# Patient Record
Sex: Female | Born: 1955 | Race: White | Hispanic: No | Smoking: Former smoker
Health system: Southern US, Community
[De-identification: ages and names within clinical notes are randomized; demographics above are authoritative.]

## PROBLEM LIST (undated history)

## (undated) DIAGNOSIS — R519 Headache, unspecified: Secondary | ICD-10-CM

## (undated) DIAGNOSIS — R51 Headache: Secondary | ICD-10-CM

## (undated) DIAGNOSIS — Z9289 Personal history of other medical treatment: Secondary | ICD-10-CM

## (undated) DIAGNOSIS — J439 Emphysema, unspecified: Secondary | ICD-10-CM

## (undated) DIAGNOSIS — I25110 Atherosclerotic heart disease of native coronary artery with unstable angina pectoris: Secondary | ICD-10-CM

## (undated) DIAGNOSIS — I214 Non-ST elevation (NSTEMI) myocardial infarction: Secondary | ICD-10-CM

## (undated) DIAGNOSIS — M1990 Unspecified osteoarthritis, unspecified site: Secondary | ICD-10-CM

## (undated) DIAGNOSIS — F419 Anxiety disorder, unspecified: Secondary | ICD-10-CM

## (undated) DIAGNOSIS — J449 Chronic obstructive pulmonary disease, unspecified: Secondary | ICD-10-CM

## (undated) DIAGNOSIS — F4321 Adjustment disorder with depressed mood: Secondary | ICD-10-CM

## (undated) HISTORY — PX: DILATION AND CURETTAGE OF UTERUS: SHX78

## (undated) HISTORY — PX: EYE SURGERY: SHX253

## (undated) HISTORY — PX: MOUTH SURGERY: SHX715

---

## 1982-06-19 DIAGNOSIS — Z9289 Personal history of other medical treatment: Secondary | ICD-10-CM

## 1982-06-19 HISTORY — DX: Personal history of other medical treatment: Z92.89

## 1997-12-15 ENCOUNTER — Other Ambulatory Visit: Admission: RE | Admit: 1997-12-15 | Discharge: 1997-12-15 | Payer: Self-pay | Admitting: Obstetrics & Gynecology

## 1998-11-26 ENCOUNTER — Other Ambulatory Visit: Admission: RE | Admit: 1998-11-26 | Discharge: 1998-11-26 | Payer: Self-pay | Admitting: Obstetrics and Gynecology

## 2000-03-22 ENCOUNTER — Other Ambulatory Visit: Admission: RE | Admit: 2000-03-22 | Discharge: 2000-03-22 | Payer: Self-pay | Admitting: Obstetrics and Gynecology

## 2001-03-25 ENCOUNTER — Other Ambulatory Visit: Admission: RE | Admit: 2001-03-25 | Discharge: 2001-03-25 | Payer: Self-pay | Admitting: Obstetrics and Gynecology

## 2002-09-10 ENCOUNTER — Other Ambulatory Visit: Admission: RE | Admit: 2002-09-10 | Discharge: 2002-09-10 | Payer: Self-pay | Admitting: Obstetrics and Gynecology

## 2003-10-23 ENCOUNTER — Other Ambulatory Visit: Admission: RE | Admit: 2003-10-23 | Discharge: 2003-10-23 | Payer: Self-pay | Admitting: Obstetrics and Gynecology

## 2003-10-27 ENCOUNTER — Encounter: Admission: RE | Admit: 2003-10-27 | Discharge: 2003-10-27 | Payer: Self-pay | Admitting: Obstetrics and Gynecology

## 2005-03-01 ENCOUNTER — Other Ambulatory Visit: Admission: RE | Admit: 2005-03-01 | Discharge: 2005-03-01 | Payer: Self-pay | Admitting: Obstetrics and Gynecology

## 2006-03-09 ENCOUNTER — Other Ambulatory Visit: Admission: RE | Admit: 2006-03-09 | Discharge: 2006-03-09 | Payer: Self-pay | Admitting: Obstetrics and Gynecology

## 2007-06-18 ENCOUNTER — Ambulatory Visit: Payer: Self-pay | Admitting: Gastroenterology

## 2007-07-01 ENCOUNTER — Ambulatory Visit: Payer: Self-pay | Admitting: Gastroenterology

## 2009-06-19 DIAGNOSIS — F4321 Adjustment disorder with depressed mood: Secondary | ICD-10-CM

## 2009-06-19 HISTORY — DX: Adjustment disorder with depressed mood: F43.21

## 2012-08-03 ENCOUNTER — Encounter (HOSPITAL_COMMUNITY): Payer: Self-pay | Admitting: *Deleted

## 2012-08-03 ENCOUNTER — Emergency Department (HOSPITAL_COMMUNITY)
Admission: EM | Admit: 2012-08-03 | Discharge: 2012-08-03 | Disposition: A | Discharge status: Home / Self Care | Payer: Self-pay | Attending: Emergency Medicine | Admitting: Emergency Medicine

## 2012-08-03 DIAGNOSIS — Z87898 Personal history of other specified conditions: Secondary | ICD-10-CM | POA: Insufficient documentation

## 2012-08-03 DIAGNOSIS — K090 Developmental odontogenic cysts: Secondary | ICD-10-CM

## 2012-08-03 DIAGNOSIS — K055 Other periodontal diseases: Secondary | ICD-10-CM | POA: Insufficient documentation

## 2012-08-03 DIAGNOSIS — I10 Essential (primary) hypertension: Secondary | ICD-10-CM | POA: Insufficient documentation

## 2012-08-03 DIAGNOSIS — R6884 Jaw pain: Secondary | ICD-10-CM | POA: Insufficient documentation

## 2012-08-03 DIAGNOSIS — F17200 Nicotine dependence, unspecified, uncomplicated: Secondary | ICD-10-CM | POA: Insufficient documentation

## 2012-08-03 MED ORDER — AMOXICILLIN 500 MG PO CAPS: 500.0000 mg | ORAL_CAPSULE | Freq: Three times a day (TID) | ORAL | Status: DC

## 2012-08-03 MED ORDER — OXYCODONE-ACETAMINOPHEN 5-325 MG PO TABS: 1.0000 | ORAL_TABLET | ORAL | Status: DC | PRN

## 2012-08-03 NOTE — ED Notes (Signed)
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

## 2012-08-05 NOTE — ED Provider Notes (Signed)
History     CSN: [id number]  Arrival date & time 08/03/12  1233   First MD Initiated Contact with Patient 08/03/12 1402      Chief Complaint  Patient presents with  . jaw pain     (Consider location/radiation/quality/duration/timing/severity/associated sxs/prior treatment) HPI Comments: Marie Young is a 57 y.o. Female presenting for evaluation of swelling along her left jawline which has been present for years,  But has become larger and tender over the past several weeks. 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 She suspects this is the source of her current symptoms.  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  She denies fevers and chills and is having no difficulty swallowing.  There has been no drainage from the site and she denies dental pain.  She has taken goody powders for relief of pain or swelling.     The history is provided by the patient.    Past Medical History  Diagnosis Date  . Hypertension     Past Surgical History  Procedure Laterality Date  . Mouth surgery      No family history on file.  History  Substance Use Topics  . Smoking status: Current Every Day Smoker  . Smokeless tobacco: Not on file  . Alcohol Use: No    OB History   Grav Para Term Preterm Abortions TAB SAB Ect Mult Living                  Review of Systems  Constitutional: Negative for fever and chills.  HENT: Positive for facial swelling. Negative for congestion, sore throat, trouble swallowing, neck pain, neck stiffness and dental problem.   Eyes: Negative.   Respiratory: Negative for chest tightness and shortness of breath.   Cardiovascular: Negative for chest pain.   Gastrointestinal: Negative for nausea and abdominal pain.  Genitourinary: Negative.   Musculoskeletal: Negative for joint swelling and arthralgias.  Skin: Negative.  Negative for rash and wound.  Neurological: Negative for dizziness, weakness, numbness and headaches.  Psychiatric/Behavioral: Negative.     Allergies  Hydrocodone; Macrobid; and Sulfa antibiotics  Home Medications   Current Outpatient Rx  Name  Route  Sig  Dispense  Refill  . Aspirin-Acetaminophen-Caffeine (GOODY HEADACHE PO)   Oral   Take 1 packet by mouth once as needed (pain).         Marland Kitchen amoxicillin (AMOXIL) 500 MG capsule   Oral   Take 1 capsule (500 mg total) by mouth 3 (three) times daily.   30 capsule   0   . oxyCODONE-acetaminophen (PERCOCET/ROXICET) 5-325 MG per tablet   Oral   Take 1 tablet by mouth every 4 (four) hours as needed for pain.   15 tablet   0     BP 167/95  Pulse 94  Temp(Src) 98.2 F (36.8 C) (Oral)  Resp 19  SpO2 98%  Physical Exam  Nursing note and vitals reviewed. Constitutional: She is oriented to person, place, and time. She appears well-developed and well-nourished.  HENT:  Head: Normocephalic and atraumatic.  Right Ear: Tympanic membrane normal.  Left Ear: Tympanic membrane normal.  Nose: Nose normal.  Mouth/Throat: Uvula is midline and oropharynx is clear and moist. Oral lesions present. Normal dentition. No dental caries. No oropharyngeal exudate, posterior oropharyngeal edema or posterior oropharyngeal erythema.  Soft,  Distinct pea sized nodule left lower gingival groove at the lateral incisors with buccal mucosal edema and fullness with indistinct margins, without fluctuance or erythema, no drainage, no open lesions.  Eyes: Conjunctivae are normal.  Neck: Normal range of motion. Neck supple.  Cardiovascular: Normal rate, regular rhythm and normal heart sounds.   Pulmonary/Chest: Effort normal and breath sounds normal. No respiratory distress.  Abdominal: She  exhibits no distension.  Musculoskeletal: Normal range of motion.  Neurological: She is alert and oriented to person, place, and time.  Skin: Skin is warm and dry. No erythema.  Psychiatric: She has a normal mood and affect.    ED Course  Procedures (including critical care time)  Informal bedside US performed by Dr. Ranae Palms,  With small fluid filled sac appreciated at site.  INCISION AND DRAINAGE Performed by: Burgess Amor Consent: Verbal consent obtained. Risks and benefits: risks, benefits and alternatives were discussed Type: abscess  Body area: left gingiva/buccal mucosa  Anesthesia: local infiltration  Needle aspiration performed.  Local anesthetic: lidocaine 2% with epinephrine  Anesthetic total: 1 ml  Complexity: simple Drainage: no obvious fluid aspirated,  Neither clear serous or purulent aspirate.  Drainage amount: n/a  Packing material: n/a Patient tolerance: Patient tolerated the procedure well with no immediate complications.     Labs Reviewed - No data to display No results found.   1. Gingival cyst of adult       MDM  Pt encouraged that she needs to see a Associate Professor for further evaluation and excision of this recurrent growth.  Referral to Dr. Warren Danes given.  Since needle aspirate was inconclusive will cover with abx,  Amoxil prescribed.  Also prescribed oxycodone for pain relief. Suggested warm compresses, avoid excessive manipulation.  Return here for any worsened sx.        Burgess Amor, [place] 08/05/12 [phone]

## 2012-08-07 NOTE — ED Provider Notes (Signed)
Medical screening examination/treatment/procedure(s) were performed by non-physician practitioner and as supervising physician I was immediately available for consultation/collaboration.   Loren Racer, MD 08/07/12 1505

## 2012-12-14 ENCOUNTER — Emergency Department (HOSPITAL_COMMUNITY)
Admission: EM | Admit: 2012-12-14 | Discharge: 2012-12-14 | Disposition: A | Discharge status: Home / Self Care | Payer: Self-pay | Attending: Emergency Medicine | Admitting: Emergency Medicine

## 2012-12-14 ENCOUNTER — Encounter (HOSPITAL_COMMUNITY): Payer: Self-pay | Admitting: *Deleted

## 2012-12-14 DIAGNOSIS — Z79899 Other long term (current) drug therapy: Secondary | ICD-10-CM | POA: Insufficient documentation

## 2012-12-14 DIAGNOSIS — R221 Localized swelling, mass and lump, neck: Secondary | ICD-10-CM | POA: Insufficient documentation

## 2012-12-14 DIAGNOSIS — R599 Enlarged lymph nodes, unspecified: Secondary | ICD-10-CM | POA: Insufficient documentation

## 2012-12-14 DIAGNOSIS — K047 Periapical abscess without sinus: Secondary | ICD-10-CM | POA: Insufficient documentation

## 2012-12-14 DIAGNOSIS — I10 Essential (primary) hypertension: Secondary | ICD-10-CM | POA: Insufficient documentation

## 2012-12-14 DIAGNOSIS — R220 Localized swelling, mass and lump, head: Secondary | ICD-10-CM | POA: Insufficient documentation

## 2012-12-14 DIAGNOSIS — F17200 Nicotine dependence, unspecified, uncomplicated: Secondary | ICD-10-CM | POA: Insufficient documentation

## 2012-12-14 MED ORDER — AMOXICILLIN-POT CLAVULANATE 875-125 MG PO TABS
1.0000 | ORAL_TABLET | Freq: Once | ORAL | Status: AC
  Administered 2012-12-14: 1 via ORAL
  Filled 2012-12-14: qty 1

## 2012-12-14 MED ORDER — AMOXICILLIN-POT CLAVULANATE 875-125 MG PO TABS: 1.0000 | ORAL_TABLET | Freq: Two times a day (BID) | ORAL | Status: DC

## 2012-12-14 MED ORDER — OXYCODONE-ACETAMINOPHEN 5-325 MG PO TABS: 2.0000 | ORAL_TABLET | ORAL | Status: DC | PRN

## 2012-12-14 NOTE — ED Notes (Signed)
Patient with left lower jaw swelling.  Patient states she has tumors that she has been seen before for.  Patient states she has has many since she was a teenager.  Dentist that usually deals with them has now retired.  Patient is very sensitive at site of most swelling.  Patient is CAOx3 at this time.

## 2012-12-14 NOTE — ED Provider Notes (Signed)
History    CSN: [id number] Arrival date & time 12/14/12  1758  First MD Initiated Contact with Patient 12/14/12 1939     Chief Complaint  Patient presents with  . Dental Pain  . Facial Swelling   (Consider location/radiation/quality/duration/timing/severity/associated sxs/prior Treatment) HPI Comments: Patient presents with dental pain and facial swelling. 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 She's had 2 removed in the past. She's had a small one that's been there for several years. Over the last 2 days she's developed some increased pain and swelling around the tooth. She denies he fevers or chills. She denies any nausea or vomiting. She denies any shortness of breath or difficulty swallowing. She says the pain is throbbing around her tooth and under her jaw.  Patient is a 57 y.o. female presenting with tooth pain.  Dental Pain Associated symptoms: facial swelling   Associated symptoms: no drooling, no fever, no headaches, no neck pain and no oral lesions    Past Medical History  Diagnosis Date  . Hypertension    Past Surgical History  Procedure Laterality Date  . Mouth surgery     History reviewed. No pertinent family history. History  Substance Use Topics  . Smoking status: Current Every Day Smoker  . Smokeless tobacco: Not on file  . Alcohol Use: No   OB History   Grav Para Term Preterm Abortions TAB SAB Ect Mult Living                 Review of Systems  Constitutional: Negative for fever.  HENT: Positive for facial swelling and dental problem. Negative for sore throat, drooling, mouth sores, trouble swallowing, neck pain and voice change.   Gastrointestinal: Negative for nausea and vomiting.  Skin: Negative for rash and wound.  Neurological: Negative for light-headedness and headaches.    Allergies  Hydrocodone; Macrobid; and Sulfa antibiotics  Home Medications   Current Outpatient Rx  Name  Route  Sig  Dispense  Refill  .  Aspirin-Salicylamide-Caffeine (BC HEADACHE POWDER PO)   Oral   Take 1 packet by mouth daily as needed (for pain).         . cephALEXin (KEFLEX) 500 MG capsule   Oral   Take 500 mg by mouth 2 (two) times daily.         Marland Kitchen guaiFENesin (ROBITUSSIN) 100 MG/5ML SOLN   Oral   Take 10 mLs by mouth every 6 (six) hours as needed (for cough/congestion).         . sodium chloride (OCEAN) 0.65 % nasal spray   Nasal   Place 1 spray into the nose as needed for congestion.         Marland Kitchen amoxicillin-clavulanate (AUGMENTIN) 875-125 MG per tablet   Oral   Take 1 tablet by mouth 2 (two) times daily. One po bid x 10 days   20 tablet   0   . oxyCODONE-acetaminophen (PERCOCET) 5-325 MG per tablet   Oral   Take 2 tablets by mouth every 4 (four) hours as needed for pain.   20 tablet   0    BP 169/89  Pulse 66  Temp(Src) 98.1 F (36.7 C) (Oral)  Resp 20  Ht 5\' 4"  (1.626 m)  Wt 123 lb (55.792 kg)  BMI 21.1 kg/m2  SpO2 97% Physical Exam  Constitutional: She is oriented to person, place, and time. She appears well-developed and well-nourished.  HENT:  Patient has a periapical abscess. around the left lower bicuspid.  There some yellow drainage from the area. There is no elevation of the tongue. There's no trismus. There some mild left cervical lymphadenopathy.  Eyes: Pupils are equal, round, and reactive to light.  Neck: Normal range of motion. Neck supple.  Pulmonary/Chest: Effort normal and breath sounds normal. No respiratory distress.  Lymphadenopathy:    She has cervical adenopathy.  Neurological: She is alert and oriented to person, place, and time.  Skin: Skin is warm and dry.    ED Course  Procedures (including critical care time) Labs Reviewed - No data to display No results found. 1. Periapical abscess     MDM  Patient with the small left. Flap says that it is currently draining. She was started on Augmentin and Percocet for pain. She was advised to followup with the [facility] dentist on call within 48 hours. She is advised to return here over the weekend if she has any worsening symptoms or increased facial swelling.  Rolan Bucco, MD 12/14/12 2019

## 2012-12-14 NOTE — ED Notes (Signed)
Pt reports having dental pain to left lower side and facial swelling. 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 Pain and swelling is spreading down into her neck. Airway is intact.

## 2012-12-16 ENCOUNTER — Telehealth (HOSPITAL_COMMUNITY): Payer: Self-pay | Admitting: Emergency Medicine

## 2012-12-18 ENCOUNTER — Telehealth (HOSPITAL_COMMUNITY): Payer: Self-pay | Admitting: Emergency Medicine

## 2012-12-18 NOTE — ED Notes (Signed)
Dr Lyndal Pulley office called stated pt referred to them needs oral surgeon referral.  Dr Lacretia Leigh office notified Dr Randa Evens office about pt and current writer faxed referral info to Dr Randa Evens office

## 2013-12-02 ENCOUNTER — Emergency Department (INDEPENDENT_AMBULATORY_CARE_PROVIDER_SITE_OTHER)
Admission: EM | Admit: 2013-12-02 | Discharge: 2013-12-02 | Disposition: A | Discharge status: Home / Self Care | Payer: Self-pay | Source: Home / Self Care

## 2013-12-02 ENCOUNTER — Encounter (HOSPITAL_COMMUNITY): Payer: Self-pay | Admitting: Emergency Medicine

## 2013-12-02 DIAGNOSIS — J309 Allergic rhinitis, unspecified: Secondary | ICD-10-CM

## 2013-12-02 DIAGNOSIS — R0982 Postnasal drip: Secondary | ICD-10-CM

## 2013-12-02 DIAGNOSIS — F17200 Nicotine dependence, unspecified, uncomplicated: Secondary | ICD-10-CM

## 2013-12-02 DIAGNOSIS — Z720 Tobacco use: Secondary | ICD-10-CM

## 2013-12-02 DIAGNOSIS — J329 Chronic sinusitis, unspecified: Secondary | ICD-10-CM

## 2013-12-02 DIAGNOSIS — J310 Chronic rhinitis: Secondary | ICD-10-CM

## 2013-12-02 MED ORDER — AMOXICILLIN-POT CLAVULANATE 875-125 MG PO TABS: 1.0000 | ORAL_TABLET | Freq: Two times a day (BID) | ORAL | Status: DC

## 2013-12-02 NOTE — Discharge Instructions (Signed)
Allergic Rhinitis Sudafed PE 10 mg for congestion Allegra or chlortrimeton for drainage flonase nasal spray Lots of saline nasal spray Lots of fluids, cold water Robitussin DM Stop smoking Allergic rhinitis is when the mucous membranes in the nose respond to allergens. Allergens are particles in the air that cause your body to have an allergic reaction. This causes you to release allergic antibodies. Through a chain of events, these eventually cause you to release histamine into the blood stream. Although meant to protect the body, it is this release of histamine that causes your discomfort, such as frequent sneezing, congestion, and an itchy, runny nose.  CAUSES  Seasonal allergic rhinitis (hay fever) is caused by pollen allergens that may come from grasses, trees, and weeds. Year-round allergic rhinitis (perennial allergic rhinitis) is caused by allergens such as house dust mites, pet dander, and mold spores.  SYMPTOMS   Nasal stuffiness (congestion).  Itchy, runny nose with sneezing and tearing of the eyes. DIAGNOSIS  Your health care provider can help you determine the allergen or allergens that trigger your symptoms. If you and your health care provider are unable to determine the allergen, skin or blood testing may be used. TREATMENT  Allergic Rhinitis does not have a cure, but it can be controlled by:  Medicines and allergy shots (immunotherapy).  Avoiding the allergen. Hay fever may often be treated with antihistamines in pill or nasal spray forms. Antihistamines block the effects of histamine. There are over-the-counter medicines that may help with nasal congestion and swelling around the eyes. Check with your health care provider before taking or giving this medicine.  If avoiding the allergen or the medicine prescribed do not work, there are many new medicines your health care provider can prescribe. Stronger medicine may be used if initial measures are ineffective. Desensitizing  injections can be used if medicine and avoidance does not work. Desensitization is when a patient is given ongoing shots until the body becomes less sensitive to the allergen. Make sure you follow up with your health care provider if problems continue. HOME CARE INSTRUCTIONS It is not possible to completely avoid allergens, but you can reduce your symptoms by taking steps to limit your exposure to them. It helps to know exactly what you are allergic to so that you can avoid your specific triggers. SEEK MEDICAL CARE IF:   You have a fever.  You develop a cough that does not stop easily (persistent).  You have shortness of breath.  You start wheezing.  Symptoms interfere with normal daily activities. Document Released: 02/28/2001 Document Revised: 03/26/2013 Document Reviewed: 02/10/2013 [facility] Patient Information 2014 [place], [place].  Sinusitis Sinusitis is redness, soreness, and puffiness (inflammation) of the air pockets in the bones of your face (sinuses). The redness, soreness, and puffiness can cause air and mucus to get trapped in your sinuses. This can allow germs to grow and cause an infection.  HOME CARE   Drink enough fluids to keep your pee (urine) clear or pale yellow.  Use a humidifier in your home.  Run a hot shower to create steam in the bathroom. Sit in the bathroom with the door closed. Breathe in the steam 3 4 times a day.  Put a warm, moist washcloth on your face 3 4 times a day, or as told by your doctor.  Use salt water sprays (saline sprays) to wet the thick fluid in your nose. This can help the sinuses drain.  Only take medicine as told by your doctor. GET HELP  RIGHT AWAY IF:   Your pain gets worse.  You have very bad headaches.  You are sick to your stomach (nauseous).  You throw up (vomit).  You are very sleepy (drowsy) all the time.  Your face is puffy (swollen).  Your vision changes.  You have a stiff neck.  You have trouble  breathing. MAKE SURE YOU:   Understand these instructions.  Will watch your condition.  Will get help right away if you are not doing well or get worse. Document Released: 11/22/2007 Document Revised: 02/28/2012 Document Reviewed: 01/09/2012 [facility] Patient Information 2014 [employer], [place].  Smoking Cessation Quitting smoking is important to your health and has many advantages. However, it is not always easy to quit since nicotine is a very addictive drug. Often times, people try 3 times or more before being able to quit. This document explains the best ways for you to prepare to quit smoking. Quitting takes hard work and a lot of effort, but you can do it. ADVANTAGES OF QUITTING SMOKING  You will live longer, feel better, and live better.  Your body will feel the impact of quitting smoking almost immediately.  Within 20 minutes, blood pressure decreases. Your pulse returns to its normal level.  After 8 hours, carbon monoxide levels in the blood return to normal. Your oxygen level increases.  After 24 hours, the chance of having a heart attack starts to decrease. Your breath, hair, and body stop smelling like smoke.  After 48 hours, damaged nerve endings begin to recover. Your sense of taste and smell improve.  After 72 hours, the body is virtually free of nicotine. Your bronchial tubes relax and breathing becomes easier.  After 2 to 12 weeks, lungs can hold more air. Exercise becomes easier and circulation improves.  The risk of having a heart attack, stroke, cancer, or lung disease is greatly reduced.  After 1 year, the risk of coronary heart disease is cut in half.  After 5 years, the risk of stroke falls to the same as a nonsmoker.  After 10 years, the risk of lung cancer is cut in half and the risk of other cancers decreases significantly.  After 15 years, the risk of coronary heart disease drops, usually to the level of a nonsmoker.  If you are pregnant, quitting  smoking will improve your chances of having a healthy baby.  The people you live with, especially any children, will be healthier.  You will have extra money to spend on things other than cigarettes. QUESTIONS TO THINK ABOUT BEFORE ATTEMPTING TO QUIT You may want to talk about your answers with your caregiver.  Why do you want to quit?  If you tried to quit in the past, what helped and what did not?  What will be the most difficult situations for you after you quit? How will you plan to handle them?  Who can help you through the tough times? Your family? Friends? A caregiver?  What pleasures do you get from smoking? What ways can you still get pleasure if you quit? Here are some questions to ask your caregiver:  How can you help me to be successful at quitting?  What medicine do you think would be best for me and how should I take it?  What should I do if I need more help?  What is smoking withdrawal like? How can I get information on withdrawal? GET READY  Set a quit date.  Change your environment by getting rid of all cigarettes, ashtrays,  matches, and lighters in your home, car, or work. Do not let people smoke in your home.  Review your past attempts to quit. Think about what worked and what did not. GET SUPPORT AND ENCOURAGEMENT You have a better chance of being successful if you have help. You can get support in many ways.  Tell your family, friends, and co-workers that you are going to quit and need their support. Ask them not to smoke around you.  Get individual, group, or telephone counseling and support. Programs are available at [employer] and health centers. Call your local health department for information about programs in your area.  Spiritual beliefs and practices may help some smokers quit.  Download a "quit meter" on your computer to keep track of quit statistics, such as how long you have gone without smoking, cigarettes not smoked, and money  saved.  Get a self-help book about quitting smoking and staying off of tobacco. LEARN NEW SKILLS AND BEHAVIORS  Distract yourself from urges to smoke. Talk to someone, go for a walk, or occupy your time with a task.  Change your normal routine. Take a different route to work. Drink tea instead of coffee. Eat breakfast in a different place.  Reduce your stress. Take a hot bath, exercise, or read a book.  Plan something enjoyable to do every day. Reward yourself for not smoking.  Explore interactive web-based programs that specialize in helping you quit. GET MEDICINE AND USE IT CORRECTLY Medicines can help you stop smoking and decrease the urge to smoke. Combining medicine with the above behavioral methods and support can greatly increase your chances of successfully quitting smoking.  Nicotine replacement therapy helps deliver nicotine to your body without the negative effects and risks of smoking. Nicotine replacement therapy includes nicotine gum, lozenges, inhalers, nasal sprays, and skin patches. Some may be available over-the-counter and others require a prescription.  Antidepressant medicine helps people abstain from smoking, but how this works is unknown. This medicine is available by prescription.  Nicotinic receptor partial agonist medicine simulates the effect of nicotine in your brain. This medicine is available by prescription. Ask your caregiver for advice about which medicines to use and how to use them based on your health history. Your caregiver will tell you what side effects to look out for if you choose to be on a medicine or therapy. Carefully read the information on the package. Do not use any other product containing nicotine while using a nicotine replacement product.  RELAPSE OR DIFFICULT SITUATIONS Most relapses occur within the first 3 months after quitting. Do not be discouraged if you start smoking again. Remember, most people try several times before finally  quitting. You may have symptoms of withdrawal because your body is used to nicotine. You may crave cigarettes, be irritable, feel very hungry, cough often, get headaches, or have difficulty concentrating. The withdrawal symptoms are only temporary. They are strongest when you first quit, but they will go away within 10 14 days. To reduce the chances of relapse, try to:  Avoid drinking alcohol. Drinking lowers your chances of successfully quitting.  Reduce the amount of caffeine you consume. Once you quit smoking, the amount of caffeine in your body increases and can give you symptoms, such as a rapid heartbeat, sweating, and anxiety.  Avoid smokers because they can make you want to smoke.  Do not let weight gain distract you. Many smokers will gain weight when they quit, usually less than 10 pounds. Eat a healthy  diet and stay active. You can always lose the weight gained after you quit.  Find ways to improve your mood other than smoking. FOR MORE INFORMATION  www.smokefree.gov  Document Released: 05/30/2001 Document Revised: 12/05/2011 Document Reviewed: 09/14/2011 [facility] Patient Information 2014 [place], [place].

## 2013-12-02 NOTE — ED Notes (Signed)
Pt c/o cold sx onset 3-4 weeks Sx include: chest/nasal congestion, facial pressure, PND, loss of gustation, decreased appetite.  Denies f/v/n/d, SOB, wheezing Alert w/no signs of acute distress.

## 2013-12-02 NOTE — ED Provider Notes (Signed)
CSN: [id number]     Arrival date & time 12/02/13  1400 History   First MD Initiated Contact with Patient 12/02/13 1525     Chief Complaint  Patient presents with  . URI   (Consider location/radiation/quality/duration/timing/severity/associated sxs/prior Treatment) HPI Comments: 58 year old female who smokes one half pack of cigarettes a day complains of chest congestion nasal congestion and cough beginning 3-4 weeks ago. She did feel a little better for several days but after being exposed to others in her family with same symptoms they redeveloped in her. She is complaining of nasal congestion, ears feeling stopped, PND and cough.    Past Medical History  Diagnosis Date  . Hypertension    Past Surgical History  Procedure Laterality Date  . Mouth surgery     No family history on file. History  Substance Use Topics  . Smoking status: Current Every Day Smoker  . Smokeless tobacco: Not on file  . Alcohol Use: No   OB History   Grav Para Term Preterm Abortions TAB SAB Ect Mult Living                 Review of Systems  Constitutional: Negative for fever, chills, activity change, appetite change and fatigue.  HENT: Positive for congestion, postnasal drip, rhinorrhea, sinus pressure and voice change. Negative for facial swelling.   Eyes: Negative.   Respiratory: Positive for cough. Negative for chest tightness and shortness of breath.   Cardiovascular: Negative.   Gastrointestinal: Negative.   Genitourinary: Negative.   Musculoskeletal: Negative for neck pain and neck stiffness.  Skin: Negative for pallor and rash.  Neurological: Negative.     Allergies  Hydrocodone; Macrobid; and Sulfa antibiotics  Home Medications   Prior to Admission medications   Medication Sig Start Date End Date Taking? Authorizing Provider  amoxicillin-clavulanate (AUGMENTIN) 875-125 MG per tablet Take 1 tablet by mouth 2 (two) times daily. One po bid x 10 days 12/14/12   Rolan BuccoMelanie Belfi, MD   amoxicillin-clavulanate (AUGMENTIN) 875-125 MG per tablet Take 1 tablet by mouth every 12 (twelve) hours. 12/02/13   Hayden Rasmussenavid Angelys Yetman, NP  Aspirin-Salicylamide-Caffeine (BC HEADACHE POWDER PO) Take 1 packet by mouth daily as needed (for pain).    Historical Provider, MD  cephALEXin (KEFLEX) 500 MG capsule Take 500 mg by mouth 2 (two) times daily.    Historical Provider, MD  guaiFENesin (ROBITUSSIN) 100 MG/5ML SOLN Take 10 mLs by mouth every 6 (six) hours as needed (for cough/congestion).    Historical Provider, MD  oxyCODONE-acetaminophen (PERCOCET) 5-325 MG per tablet Take 2 tablets by mouth every 4 (four) hours as needed for pain. 12/14/12   Rolan BuccoMelanie Belfi, MD  sodium chloride (OCEAN) 0.65 % nasal spray Place 1 spray into the nose as needed for congestion.    Historical Provider, MD   BP 158/86  Pulse 83  Temp(Src) 97.7 F (36.5 C) (Oral)  Resp 16  SpO2 100% Physical Exam  Nursing note and vitals reviewed. Constitutional: She is oriented to person, place, and time. She appears well-developed and well-nourished. No distress.  HENT:  Mouth/Throat: No oropharyngeal exudate.  Bilateral TMs with mild retraction, no erythema Oropharynx with moderate erythema, cobblestoning and thick, slightly gray PND.  Eyes: Conjunctivae and EOM are normal.  Neck: Normal range of motion. Neck supple.  Cardiovascular: Normal rate, regular rhythm and normal heart sounds.   Pulmonary/Chest: Effort normal and breath sounds normal. No respiratory distress. She has no wheezes. She has no rales.  Musculoskeletal: Normal range of motion. She  exhibits no edema.  Lymphadenopathy:    She has no cervical adenopathy.  Neurological: She is alert and oriented to person, place, and time.  Skin: Skin is warm and dry. No rash noted.  Psychiatric: She has a normal mood and affect.    ED Course  Procedures (including critical care time) Labs Review Labs Reviewed - No data to display  Imaging Review No results found.   MDM    1. Rhinosinusitis   2. PND (post-nasal drip)   3. Allergic rhinitis   4. Tobacco abuse disorder    augmentin as dir  Sudafed PE 10 mg for congestion Allegra or chlortrimeton for drainage flonase nasal spray Lots of saline nasal spray Lots of fluids, cold water Robitussin DM Stop smoking   Hayden Rasmussenavid Camaria Gerald, NP 12/02/13 1546

## 2013-12-03 NOTE — ED Provider Notes (Signed)
Medical screening examination/treatment/procedure(s) were performed by non-physician practitioner and as supervising physician I was immediately available for consultation/collaboration.  Leslee Homeavid Keller, M.D.  Reuben Likesavid C Keller, MD 12/03/13 [phone]

## 2013-12-24 NOTE — ED Notes (Signed)
Pt  Phoned   Requesting   A  rx  Phoned in  For  Congestion   This  Writer  spoke  With  Hayden Rasmussenavid  mabe    Advised  Needed  To be reseen   Pt so  Advised

## 2014-06-19 HISTORY — PX: CORNEAL LACERATION REPAIR: SHX5331

## 2016-03-20 HISTORY — PX: SHOULDER ARTHROSCOPY WITH ROTATOR CUFF REPAIR: SHX5685

## 2016-06-07 ENCOUNTER — Emergency Department (HOSPITAL_COMMUNITY): Payer: BLUE CROSS/BLUE SHIELD

## 2016-06-07 ENCOUNTER — Encounter (HOSPITAL_COMMUNITY): Payer: Self-pay | Admitting: Neurology

## 2016-06-07 ENCOUNTER — Inpatient Hospital Stay (HOSPITAL_COMMUNITY)
Admission: EM | Admit: 2016-06-07 | Discharge: 2016-06-14 | DRG: 234 | Disposition: A | Discharge status: Home / Self Care | Payer: BLUE CROSS/BLUE SHIELD | Attending: Thoracic Surgery (Cardiothoracic Vascular Surgery) | Admitting: Thoracic Surgery (Cardiothoracic Vascular Surgery)

## 2016-06-07 DIAGNOSIS — Z720 Tobacco use: Secondary | ICD-10-CM | POA: Diagnosis present

## 2016-06-07 DIAGNOSIS — R079 Chest pain, unspecified: Secondary | ICD-10-CM

## 2016-06-07 DIAGNOSIS — I10 Essential (primary) hypertension: Secondary | ICD-10-CM | POA: Diagnosis present

## 2016-06-07 DIAGNOSIS — J9811 Atelectasis: Secondary | ICD-10-CM

## 2016-06-07 DIAGNOSIS — I214 Non-ST elevation (NSTEMI) myocardial infarction: Principal | ICD-10-CM | POA: Diagnosis present

## 2016-06-07 DIAGNOSIS — E8770 Fluid overload, unspecified: Secondary | ICD-10-CM | POA: Diagnosis not present

## 2016-06-07 DIAGNOSIS — D62 Acute posthemorrhagic anemia: Secondary | ICD-10-CM | POA: Diagnosis not present

## 2016-06-07 DIAGNOSIS — F17200 Nicotine dependence, unspecified, uncomplicated: Secondary | ICD-10-CM | POA: Diagnosis present

## 2016-06-07 DIAGNOSIS — Z79899 Other long term (current) drug therapy: Secondary | ICD-10-CM

## 2016-06-07 DIAGNOSIS — J432 Centrilobular emphysema: Secondary | ICD-10-CM | POA: Diagnosis present

## 2016-06-07 DIAGNOSIS — F410 Panic disorder [episodic paroxysmal anxiety] without agoraphobia: Secondary | ICD-10-CM | POA: Diagnosis present

## 2016-06-07 DIAGNOSIS — F419 Anxiety disorder, unspecified: Secondary | ICD-10-CM | POA: Diagnosis present

## 2016-06-07 DIAGNOSIS — Z951 Presence of aortocoronary bypass graft: Secondary | ICD-10-CM

## 2016-06-07 DIAGNOSIS — F329 Major depressive disorder, single episode, unspecified: Secondary | ICD-10-CM | POA: Diagnosis present

## 2016-06-07 DIAGNOSIS — I25110 Atherosclerotic heart disease of native coronary artery with unstable angina pectoris: Secondary | ICD-10-CM | POA: Diagnosis present

## 2016-06-07 DIAGNOSIS — Z7952 Long term (current) use of systemic steroids: Secondary | ICD-10-CM

## 2016-06-07 DIAGNOSIS — I252 Old myocardial infarction: Secondary | ICD-10-CM

## 2016-06-07 DIAGNOSIS — E785 Hyperlipidemia, unspecified: Secondary | ICD-10-CM | POA: Diagnosis present

## 2016-06-07 DIAGNOSIS — Z8249 Family history of ischemic heart disease and other diseases of the circulatory system: Secondary | ICD-10-CM

## 2016-06-07 HISTORY — DX: Emphysema, unspecified: J43.9

## 2016-06-07 HISTORY — DX: Chronic obstructive pulmonary disease, unspecified: J44.9

## 2016-06-07 HISTORY — DX: Non-ST elevation (NSTEMI) myocardial infarction: I21.4

## 2016-06-07 HISTORY — DX: Personal history of other medical treatment: Z92.89

## 2016-06-07 HISTORY — DX: Atherosclerotic heart disease of native coronary artery with unstable angina pectoris: I25.110

## 2016-06-07 HISTORY — DX: Headache: R51

## 2016-06-07 HISTORY — DX: Adjustment disorder with depressed mood: F43.21

## 2016-06-07 HISTORY — DX: Headache, unspecified: R51.9

## 2016-06-07 HISTORY — DX: Unspecified osteoarthritis, unspecified site: M19.90

## 2016-06-07 HISTORY — DX: Anxiety disorder, unspecified: F41.9

## 2016-06-07 LAB — BASIC METABOLIC PANEL
ANION GAP: 9 (ref 5–15)
BUN: 11 mg/dL (ref 6–20)
CALCIUM: 9.6 mg/dL (ref 8.9–10.3)
CO2: 22 mmol/L (ref 22–32)
Chloride: 108 mmol/L (ref 101–111)
Creatinine, Ser: 0.56 mg/dL (ref 0.44–1.00)
Glucose, Bld: 116 mg/dL — ABNORMAL HIGH (ref 65–99)
Potassium: 4.1 mmol/L (ref 3.5–5.1)
SODIUM: 139 mmol/L (ref 135–145)

## 2016-06-07 LAB — CBC
HCT: 41.2 % (ref 36.0–46.0)
HEMOGLOBIN: 13.9 g/dL (ref 12.0–15.0)
MCH: 32 pg (ref 26.0–34.0)
MCHC: 33.7 g/dL (ref 30.0–36.0)
MCV: 94.9 fL (ref 78.0–100.0)
Platelets: 510 10*3/uL — ABNORMAL HIGH (ref 150–400)
RBC: 4.34 MIL/uL (ref 3.87–5.11)
RDW: 14.3 % (ref 11.5–15.5)
WBC: 13.9 10*3/uL — AB (ref 4.0–10.5)

## 2016-06-07 LAB — I-STAT TROPONIN, ED
TROPONIN I, POC: 0.02 ng/mL (ref 0.00–0.08)
TROPONIN I, POC: 0.04 ng/mL (ref 0.00–0.08)

## 2016-06-07 LAB — D-DIMER, QUANTITATIVE (NOT AT ARMC): D DIMER QUANT: 3.44 ug{FEU}/mL — AB (ref 0.00–0.50)

## 2016-06-07 MED ORDER — ASPIRIN 81 MG PO CHEW
324.0000 mg | CHEWABLE_TABLET | Freq: Once | ORAL | Status: AC
  Administered 2016-06-07: 324 mg via ORAL
  Filled 2016-06-07: qty 4

## 2016-06-07 MED ORDER — IOPAMIDOL (ISOVUE-370) INJECTION 76%
INTRAVENOUS | Status: AC
  Administered 2016-06-07: 85 mL
  Filled 2016-06-07: qty 100

## 2016-06-07 NOTE — ED Notes (Signed)
Patient transported to CT 

## 2016-06-07 NOTE — ED Provider Notes (Signed)
[facility]-EMERGENCY DEPT Provider Note   CSN: [id number] Arrival date & time: 06/07/16  1814     History   Chief Complaint Chief Complaint  Patient presents with  . Chest Pain    HPI Hubert AzureSandra K Oguin is a 60 y.o. female.  HPI Patient presents with episodic chest pain for the last few months. She associates this with rotator cuff repair of the right shoulder. She states she has episodic pain that starts in the right shoulder and spreads across her chest and then into her thoracic back. She describes the pain as spasm-like. She's been treated with Valium. States that she was in argument with her daughter's boyfriend. She began to have central chest pain that then radiated to her thoracic back. No associated shortness of breath but patient was quite tearful and upset. She states that her pain has completely resolved at this point. No new lower extremity swelling or pain. No recent extended travel. No history of early coronary artery disease in her family. Patient does have a history of smoking for the past 30 years. States she is trying to quit. Past Medical History:  Diagnosis Date  . Anxiety   . COPD (chronic obstructive pulmonary disease) (HCC)   . Coronary artery disease involving native coronary artery of native heart with unstable angina pectoris (HCC) 06/08/2016  . NSTEMI (non-ST elevated myocardial infarction) (HCC) 06/08/2016    Patient Active Problem List   Diagnosis Date Noted  . Chest pain 06/08/2016  . Centrilobular emphysema (HCC) 06/08/2016  . Tobacco abuse 06/08/2016  . Anxiety 06/08/2016  . NSTEMI (non-ST elevated myocardial infarction) (HCC) 06/08/2016  . Coronary artery disease involving native coronary artery of native heart with unstable angina pectoris (HCC) 06/08/2016    Past Surgical History:  Procedure Laterality Date  . MOUTH SURGERY    . ROTATOR CUFF REPAIR Right 03/20/2016   Dr. Rennis ChrisSupple    OB History    No data available       Home Medications      Prior to Admission medications   Medication Sig Start Date End Date Taking? Authorizing Provider  Aspirin-Salicylamide-Caffeine (BC HEADACHE POWDER PO) Take 1 packet by mouth daily as needed (for pain).   Yes Historical Provider, MD  benzonatate (TESSALON) 100 MG capsule Take 100 mg by mouth 3 (three) times daily as needed for cough. 05/20/16  Yes Historical Provider, MD  COMBIVENT RESPIMAT 20-100 MCG/ACT AERS respimat Inhale 2 puffs into the lungs 2 (two) times daily. 05/20/16  Yes Historical Provider, MD  diazepam (VALIUM) 5 MG tablet Take 5 mg by mouth every morning. 05/09/16  Yes Historical Provider, MD  naproxen (NAPROSYN) 500 MG tablet Take 500 mg by mouth daily as needed for pain. 03/24/16  Yes Historical Provider, MD  traMADol (ULTRAM) 50 MG tablet Take 50 mg by mouth daily as needed for pain. 05/09/16  Yes Historical Provider, MD    Family History Family History  Problem Relation Age of Onset  . Dementia Mother   . Heart disease Sister     CABG?  age 61?  Marland Kitchen. Valvular heart disease Brother     Social History Social History  Substance Use Topics  . Smoking status: Current Every Day Smoker  . Smokeless tobacco: Never Used  . Alcohol use No     Allergies   Macrobid [nitrofurantoin macrocrystal]; Sulfa antibiotics; and Hydrocodone   Review of Systems Review of Systems  Constitutional: Negative for chills and fever.  Respiratory: Negative for cough and shortness of breath.  Cardiovascular: Positive for chest pain. Negative for leg swelling.  Gastrointestinal: Negative for abdominal pain, diarrhea, nausea and vomiting.  Genitourinary: Negative for difficulty urinating, dysuria and flank pain.  Musculoskeletal: Positive for back pain and myalgias. Negative for joint swelling, neck pain and neck stiffness.  Skin: Negative for rash and wound.  Neurological: Negative for dizziness, light-headedness, numbness and headaches.  Psychiatric/Behavioral: The patient is  nervous/anxious.   All other systems reviewed and are negative.    Physical Exam Updated Vital Signs BP 121/67   Pulse 81   Temp 97.6 F (36.4 C) (Oral)   Resp 18   Ht 5\' 4"  (1.626 m)   Wt 121 lb (54.9 kg)   SpO2 94%   BMI 20.77 kg/m   Physical Exam  Constitutional: She is oriented to person, place, and time. She appears well-developed and well-nourished.  HENT:  Head: Normocephalic and atraumatic.  Mouth/Throat: Oropharynx is clear and moist.  Eyes: EOM are normal. Pupils are equal, round, and reactive to light.  Neck: Normal range of motion. Neck supple. No JVD present.  Cardiovascular: Normal rate, regular rhythm and normal heart sounds.  Exam reveals no gallop and no friction rub.   No murmur heard. Pulmonary/Chest: Effort normal and breath sounds normal. No respiratory distress. She has no wheezes. She has no rales. She exhibits no tenderness.  No chest wall tenderness to palpation.  Abdominal: Soft. Bowel sounds are normal. There is no tenderness. There is no rebound and no guarding.  Musculoskeletal: Normal range of motion. She exhibits no edema or tenderness.  No lower extremity swelling, asymmetry or tenderness. No midline thoracic or lumbar tenderness. No CVA tenderness bilaterally. Distal pulses intact.  Neurological: She is alert and oriented to person, place, and time.  Moves all extremity is without deficit. Sensation intact.  Skin: Skin is warm and dry. No rash noted. No erythema.  Psychiatric: She has a normal mood and affect. Her behavior is normal.  Nursing note and vitals reviewed.    ED Treatments / Results  Labs (all labs ordered are listed, but only abnormal results are displayed) Labs Reviewed  BASIC METABOLIC PANEL - Abnormal; Notable for the following:       Result Value   Glucose, Bld 116 (*)    All other components within normal limits  CBC - Abnormal; Notable for the following:    WBC 13.9 (*)    Platelets 510 (*)    All other components  within normal limits  D-DIMER, QUANTITATIVE (NOT AT [facility]) - Abnormal; Notable for the following:    D-Dimer, Quant 3.44 (*)    All other components within normal limits  TROPONIN I - Abnormal; Notable for the following:    Troponin I 0.21 (*)    All other components within normal limits  TROPONIN I - Abnormal; Notable for the following:    Troponin I 0.40 (*)    All other components within normal limits  LIPID PANEL - Abnormal; Notable for the following:    LDL Cholesterol 124 (*)    All other components within normal limits  HEPARIN LEVEL (UNFRACTIONATED) - Abnormal; Notable for the following:    Heparin Unfractionated 0.18 (*)    All other components within normal limits  BASIC METABOLIC PANEL - Abnormal; Notable for the following:    Glucose, Bld 103 (*)    All other components within normal limits  MRSA PCR SCREENING  PROTIME-INR  HEMOGLOBIN A1C  LIPID PANEL  HEMOGLOBIN A1C  I-STAT TROPOININ, ED  Rosezena Sensor, ED    EKG  EKG Interpretation  Date/Time:  Wednesday June 07 2016 22:11:12 EST Ventricular Rate:  79 PR Interval:  136 QRS Duration: 94 QT Interval:  387 QTC Calculation: 444 R Axis:   75 Text Interpretation:  Sinus rhythm Repol abnrm, severe global ischemia (LM/MVD) rate slower since previous , ST depressions still present  Confirmed by YAO  MD, Reveca Desmarais ([id number]) on 06/08/2016 2:48:44 PM       Radiology Dg Chest 2 View  Result Date: 06/07/2016 CLINICAL DATA:  Right-sided chest wall spasms, intermittent for 2 months. Much worse today. EXAM: CHEST  2 VIEW COMPARISON:  None. FINDINGS: There is moderate hyperinflation. Minimal linear scarring or atelectasis in the medial left base. The lungs are otherwise clear. There is no pleural effusion. Pulmonary vasculature is normal. Heart size is normal. Hilar and mediastinal contours are unremarkable. Small hiatal hernia. IMPRESSION: Hyperinflation.  Hiatal hernia.  No acute cardiopulmonary findings. Electronically  Signed   By: Ellery Plunk M.D.   On: 06/07/2016 19:14   Ct Angio Chest Pe W Or Wo Contrast  Result Date: 06/08/2016 CLINICAL DATA:  60 year old female with chest pain. EXAM: CT ANGIOGRAPHY CHEST WITH CONTRAST TECHNIQUE: Multidetector CT imaging of the chest was performed using the standard protocol during bolus administration of intravenous contrast. Multiplanar CT image reconstructions and MIPs were obtained to evaluate the vascular anatomy. CONTRAST:  85 cc Isovue 370 COMPARISON:  Chest radiograph dated 06/07/2016 FINDINGS: Cardiovascular: There is no cardiomegaly or pericardial effusion. Coronary vascular calcification involving the LAD. There is mild calcified and noncalcified plaque along the course of the thoracic aorta. There is no aneurysmal dilatation or evidence of dissection. The origins of the great vessels of the aortic arch appear patent. No CT evidence of pulmonary embolism. Mediastinum/Nodes: No hilar or mediastinal adenopathy. There is moderate size hiatal hernia containing portion of the stomach. The esophagus is grossly unremarkable. No thyroid nodules identified. Lungs/Pleura: There is mild centrilobular emphysema. Small right upper lobe anterior subpleural left. No focal consolidation, pleural effusion, or pneumothorax. Focal area of scarring noted in the lingula. The central airways are patent. Upper Abdomen: No acute abnormality. Musculoskeletal: Multilevel degenerative changes of the spine. No acute fracture. Review of the MIP images confirms the above findings. IMPRESSION: No acute intrathoracic pathology. No CT evidence of pulmonary embolism or aortic dissection. Electronically Signed   By: Elgie Collard M.D.   On: 06/08/2016 00:23    Procedures Procedures (including critical care time)  Medications Ordered in ED Medications  ipratropium-albuterol (DUONEB) 0.5-2.5 (3) MG/3ML nebulizer solution 3 mL ( Inhalation MAR Unhold 06/08/16 1653)  gi cocktail  (Maalox,Lidocaine,Donnatal) ( Oral MAR Unhold 06/08/16 1653)  acetaminophen (TYLENOL) tablet 650 mg (650 mg Oral Given 06/08/16 1812)  ondansetron (ZOFRAN) injection 4 mg ( Intravenous MAR Unhold 06/08/16 1653)  aspirin EC tablet 81 mg ( Oral MAR Unhold 06/08/16 1653)  nitroGLYCERIN (NITROSTAT) SL tablet 0.4 mg ( Sublingual MAR Unhold 06/08/16 1653)  metoprolol tartrate (LOPRESSOR) tablet 12.5 mg ( Oral MAR Unhold 06/08/16 1653)  atorvastatin (LIPITOR) tablet 80 mg ( Oral MAR Unhold 06/08/16 1653)  sodium chloride flush (NS) 0.9 % injection 3 mL (not administered)  sodium chloride flush (NS) 0.9 % injection 3 mL (not administered)  0.9 %  sodium chloride infusion (not administered)  0.9% sodium chloride infusion (1 mL/kg/hr  54.9 kg Intravenous New Bag/Given 06/08/16 1816)  heparin ADULT infusion 100 units/mL (25000 units/243mL sodium chloride 0.45%) (not administered)  diazepam (VALIUM) tablet 5-10 mg (  not administered)  aspirin chewable tablet 324 mg (324 mg Oral Given 06/07/16 2236)  iopamidol (ISOVUE-370) 76 % injection (85 mLs  Contrast Given 06/07/16 2355)  heparin bolus via infusion 3,000 Units (3,000 Units Intravenous Bolus from Bag 06/08/16 0317)  aspirin chewable tablet 81 mg (81 mg Oral Given 06/08/16 1319)  heparin bolus via infusion 800 Units (800 Units Intravenous Bolus from Bag 06/08/16 1115)     Initial Impression / Assessment and Plan / ED Course  I have reviewed the triage vital signs and the nursing notes.  Pertinent labs & imaging results that were available during my care of the patient were reviewed by me and considered in my medical decision making (see chart for details).  Clinical Course      EKG with diffuse ST segment depression.  Patient currently asymptomatic. Discussed with cardiology who reviewed EKG and will follow up on CTA chest. Oncoming emergency room physician to follow-up CTA chest.  Final Clinical Impressions(s) / ED Diagnoses   Final  diagnoses:  Chest pain in adult    New Prescriptions Current Discharge Medication List       Loren Raceravid Jedrick Hutcherson, MD 06/08/16 1830

## 2016-06-07 NOTE — ED Triage Notes (Signed)
Pt reports today has several sharp intermittent chest pains. Reports central cp that radiates across her chest and around to her back. Has been having CP for several weeks but not as bad as today or as intense. Has been under a lot of stress. Pt is crying in triage. 4/10 at current

## 2016-06-08 ENCOUNTER — Inpatient Hospital Stay (HOSPITAL_COMMUNITY): Payer: BLUE CROSS/BLUE SHIELD

## 2016-06-08 ENCOUNTER — Encounter (HOSPITAL_COMMUNITY): Payer: Self-pay | Admitting: Family Medicine

## 2016-06-08 ENCOUNTER — Encounter (HOSPITAL_COMMUNITY)
Admission: EM | Disposition: A | Discharge status: Home / Self Care | Payer: Self-pay | Source: Home / Self Care | Attending: Thoracic Surgery (Cardiothoracic Vascular Surgery)

## 2016-06-08 DIAGNOSIS — F17200 Nicotine dependence, unspecified, uncomplicated: Secondary | ICD-10-CM

## 2016-06-08 DIAGNOSIS — E8770 Fluid overload, unspecified: Secondary | ICD-10-CM | POA: Diagnosis not present

## 2016-06-08 DIAGNOSIS — I10 Essential (primary) hypertension: Secondary | ICD-10-CM | POA: Diagnosis present

## 2016-06-08 DIAGNOSIS — I25110 Atherosclerotic heart disease of native coronary artery with unstable angina pectoris: Secondary | ICD-10-CM | POA: Diagnosis not present

## 2016-06-08 DIAGNOSIS — D62 Acute posthemorrhagic anemia: Secondary | ICD-10-CM | POA: Diagnosis not present

## 2016-06-08 DIAGNOSIS — F419 Anxiety disorder, unspecified: Secondary | ICD-10-CM | POA: Diagnosis not present

## 2016-06-08 DIAGNOSIS — I2510 Atherosclerotic heart disease of native coronary artery without angina pectoris: Secondary | ICD-10-CM

## 2016-06-08 DIAGNOSIS — Z79899 Other long term (current) drug therapy: Secondary | ICD-10-CM | POA: Diagnosis not present

## 2016-06-08 DIAGNOSIS — R079 Chest pain, unspecified: Secondary | ICD-10-CM

## 2016-06-08 DIAGNOSIS — E785 Hyperlipidemia, unspecified: Secondary | ICD-10-CM | POA: Diagnosis present

## 2016-06-08 DIAGNOSIS — J432 Centrilobular emphysema: Secondary | ICD-10-CM | POA: Diagnosis present

## 2016-06-08 DIAGNOSIS — I214 Non-ST elevation (NSTEMI) myocardial infarction: Secondary | ICD-10-CM | POA: Diagnosis present

## 2016-06-08 DIAGNOSIS — Z7952 Long term (current) use of systemic steroids: Secondary | ICD-10-CM | POA: Diagnosis not present

## 2016-06-08 DIAGNOSIS — I252 Old myocardial infarction: Secondary | ICD-10-CM | POA: Diagnosis not present

## 2016-06-08 DIAGNOSIS — Z720 Tobacco use: Secondary | ICD-10-CM | POA: Diagnosis present

## 2016-06-08 DIAGNOSIS — F410 Panic disorder [episodic paroxysmal anxiety] without agoraphobia: Secondary | ICD-10-CM | POA: Diagnosis present

## 2016-06-08 DIAGNOSIS — Z951 Presence of aortocoronary bypass graft: Secondary | ICD-10-CM | POA: Diagnosis not present

## 2016-06-08 DIAGNOSIS — J439 Emphysema, unspecified: Secondary | ICD-10-CM

## 2016-06-08 DIAGNOSIS — J9811 Atelectasis: Secondary | ICD-10-CM | POA: Diagnosis not present

## 2016-06-08 DIAGNOSIS — Z8249 Family history of ischemic heart disease and other diseases of the circulatory system: Secondary | ICD-10-CM | POA: Diagnosis not present

## 2016-06-08 DIAGNOSIS — R071 Chest pain on breathing: Secondary | ICD-10-CM | POA: Diagnosis not present

## 2016-06-08 DIAGNOSIS — F329 Major depressive disorder, single episode, unspecified: Secondary | ICD-10-CM | POA: Diagnosis present

## 2016-06-08 HISTORY — DX: Atherosclerotic heart disease of native coronary artery with unstable angina pectoris: I25.110

## 2016-06-08 HISTORY — PX: CARDIAC CATHETERIZATION: SHX172

## 2016-06-08 LAB — BLOOD GAS, ARTERIAL
Acid-base deficit: 0.8 mmol/L (ref 0.0–2.0)
Bicarbonate: 23.7 mmol/L (ref 20.0–28.0)
Drawn by: 398981
FIO2: 0.21
O2 SAT: 94.8 %
PCO2 ART: 41 mmHg (ref 32.0–48.0)
PO2 ART: 81.4 mmHg — AB (ref 83.0–108.0)
Patient temperature: 98.6
pH, Arterial: 7.379 (ref 7.350–7.450)

## 2016-06-08 LAB — ECHOCARDIOGRAM COMPLETE
CHL CUP DOP CALC LVOT VTI: 21.5 cm
CHL CUP MV DEC (S): 254
E/e' ratio: 7.6
EWDT: 254 ms
FS: 32 % (ref 28–44)
IVS/LV PW RATIO, ED: 0.95
LA diam index: 1.52 cm/m2
LA vol index: 29 mL/m2
LASIZE: 24 mm
LAVOL: 45.7 mL
LAVOLA4C: 41.7 mL
LEFT ATRIUM END SYS DIAM: 24 mm
LV E/e'average: 7.6
LV PW d: 9.5 mm — AB (ref 0.6–1.1)
LV TDI E'MEDIAL: 5.8
LVEEMED: 7.6
LVELAT: 9.06 cm/s
LVOT area: 3.14 cm2
LVOTD: 20 mm
LVOTPV: 91.6 cm/s
LVOTSV: 68 mL
MVPKAVEL: 67.4 m/s
MVPKEVEL: 68.9 m/s
RV LATERAL S' VELOCITY: 11.9 cm/s
TAPSE: 24.3 mm
TDI e' lateral: 9.06
Weight: 1938 oz

## 2016-06-08 LAB — URINALYSIS, ROUTINE W REFLEX MICROSCOPIC
Bilirubin Urine: NEGATIVE
Glucose, UA: 150 mg/dL — AB
Hgb urine dipstick: NEGATIVE
KETONES UR: NEGATIVE mg/dL
LEUKOCYTES UA: NEGATIVE
NITRITE: NEGATIVE
PH: 6 (ref 5.0–8.0)
PROTEIN: NEGATIVE mg/dL
Specific Gravity, Urine: 1.017 (ref 1.005–1.030)

## 2016-06-08 LAB — PROTIME-INR
INR: 0.97
PROTHROMBIN TIME: 12.8 s (ref 11.4–15.2)

## 2016-06-08 LAB — BASIC METABOLIC PANEL
ANION GAP: 8 (ref 5–15)
BUN: 7 mg/dL (ref 6–20)
CO2: 25 mmol/L (ref 22–32)
Calcium: 9.2 mg/dL (ref 8.9–10.3)
Chloride: 107 mmol/L (ref 101–111)
Creatinine, Ser: 0.62 mg/dL (ref 0.44–1.00)
Glucose, Bld: 103 mg/dL — ABNORMAL HIGH (ref 65–99)
Potassium: 4 mmol/L (ref 3.5–5.1)
Sodium: 140 mmol/L (ref 135–145)

## 2016-06-08 LAB — LIPID PANEL
CHOL/HDL RATIO: 3.4 ratio
CHOLESTEROL: 193 mg/dL (ref 0–200)
HDL: 57 mg/dL (ref >40)
LDL Cholesterol: 124 mg/dL — ABNORMAL HIGH (ref 0–99)
Triglycerides: 58 mg/dL (ref <150)
VLDL: 12 mg/dL (ref 0–40)

## 2016-06-08 LAB — HEPARIN LEVEL (UNFRACTIONATED): HEPARIN UNFRACTIONATED: 0.18 [IU]/mL — AB (ref 0.30–0.70)

## 2016-06-08 LAB — TROPONIN I
TROPONIN I: 0.21 ng/mL — AB (ref <0.03)
Troponin I: 0.4 ng/mL (ref <0.03)

## 2016-06-08 LAB — ABO/RH: ABO/RH(D): A POS

## 2016-06-08 LAB — MRSA PCR SCREENING: MRSA by PCR: NEGATIVE

## 2016-06-08 SURGERY — LEFT HEART CATH AND CORONARY ANGIOGRAPHY

## 2016-06-08 MED ORDER — TRANEXAMIC ACID (OHS) PUMP PRIME SOLUTION
2.0000 mg/kg | INTRAVENOUS | Status: DC
  Filled 2016-06-08: qty 1.1

## 2016-06-08 MED ORDER — HEPARIN (PORCINE) IN NACL 2-0.9 UNIT/ML-% IJ SOLN
INTRAMUSCULAR | Status: AC
  Filled 2016-06-08: qty 1000

## 2016-06-08 MED ORDER — PLASMA-LYTE 148 IV SOLN
INTRAVENOUS | Status: AC
  Administered 2016-06-09: 08:00:00 500 mL
  Filled 2016-06-08: qty 2.5

## 2016-06-08 MED ORDER — VERAPAMIL HCL 2.5 MG/ML IV SOLN
INTRAVENOUS | Status: AC
  Filled 2016-06-08: qty 2

## 2016-06-08 MED ORDER — CHLORHEXIDINE GLUCONATE 4 % EX LIQD
60.0000 mL | Freq: Once | CUTANEOUS | Status: AC
  Administered 2016-06-09: 4 via TOPICAL
  Filled 2016-06-08: qty 60

## 2016-06-08 MED ORDER — HEPARIN BOLUS VIA INFUSION
800.0000 [IU] | Freq: Once | INTRAVENOUS | Status: AC
  Administered 2016-06-08: 800 [IU] via INTRAVENOUS
  Filled 2016-06-08: qty 800

## 2016-06-08 MED ORDER — HEPARIN (PORCINE) IN NACL 2-0.9 UNIT/ML-% IJ SOLN
INTRAMUSCULAR | Status: DC | PRN
  Administered 2016-06-08: 1000 mL

## 2016-06-08 MED ORDER — SODIUM CHLORIDE 0.9 % WEIGHT BASED INFUSION
3.0000 mL/kg/h | INTRAVENOUS | Status: DC
  Administered 2016-06-08: 3 mL/kg/h via INTRAVENOUS

## 2016-06-08 MED ORDER — SODIUM CHLORIDE 0.9 % IV SOLN
INTRAVENOUS | Status: DC
  Filled 2016-06-08: qty 30

## 2016-06-08 MED ORDER — LIDOCAINE HCL (PF) 1 % IJ SOLN
INTRAMUSCULAR | Status: AC
  Filled 2016-06-08: qty 30

## 2016-06-08 MED ORDER — TRANEXAMIC ACID 1000 MG/10ML IV SOLN
1.5000 mg/kg/h | INTRAVENOUS | Status: DC
  Administered 2016-06-09: 1.5 mg/kg/h via INTRAVENOUS
  Filled 2016-06-08: qty 25

## 2016-06-08 MED ORDER — ATORVASTATIN CALCIUM 80 MG PO TABS
80.0000 mg | ORAL_TABLET | Freq: Every day | ORAL | Status: DC
  Administered 2016-06-08: 80 mg via ORAL
  Filled 2016-06-08 (×2): qty 1

## 2016-06-08 MED ORDER — DEXTROSE 5 % IV SOLN
1.5000 g | INTRAVENOUS | Status: DC
  Administered 2016-06-09: 1.5 g via INTRAVENOUS
  Administered 2016-06-09: .75 g via INTRAVENOUS
  Filled 2016-06-08: qty 1.5

## 2016-06-08 MED ORDER — SODIUM CHLORIDE 0.9% FLUSH: 3.0000 mL | Freq: Two times a day (BID) | INTRAVENOUS | Status: DC

## 2016-06-08 MED ORDER — ACETAMINOPHEN 325 MG PO TABS
650.0000 mg | ORAL_TABLET | ORAL | Status: DC | PRN
  Administered 2016-06-08: 18:00:00 650 mg via ORAL
  Filled 2016-06-08: qty 2

## 2016-06-08 MED ORDER — MAGNESIUM SULFATE 50 % IJ SOLN
40.0000 meq | INTRAMUSCULAR | Status: DC
  Filled 2016-06-08: qty 10

## 2016-06-08 MED ORDER — METOPROLOL TARTRATE 12.5 MG HALF TABLET
12.5000 mg | ORAL_TABLET | Freq: Once | ORAL | Status: AC
  Administered 2016-06-09: 12.5 mg via ORAL
  Filled 2016-06-08: qty 1

## 2016-06-08 MED ORDER — VANCOMYCIN HCL 10 G IV SOLR
1250.0000 mg | INTRAVENOUS | Status: DC
  Administered 2016-06-09: 1250 mg via INTRAVENOUS
  Filled 2016-06-08: qty 1250

## 2016-06-08 MED ORDER — METOPROLOL TARTRATE 12.5 MG HALF TABLET
12.5000 mg | ORAL_TABLET | Freq: Two times a day (BID) | ORAL | Status: DC
  Administered 2016-06-08 (×2): 12.5 mg via ORAL
  Filled 2016-06-08 (×2): qty 1

## 2016-06-08 MED ORDER — DEXMEDETOMIDINE HCL IN NACL 400 MCG/100ML IV SOLN
0.1000 ug/kg/h | INTRAVENOUS | Status: DC
  Administered 2016-06-09: .3 ug/kg/h via INTRAVENOUS
  Filled 2016-06-08: qty 100

## 2016-06-08 MED ORDER — DOPAMINE-DEXTROSE 3.2-5 MG/ML-% IV SOLN
0.0000 ug/kg/min | INTRAVENOUS | Status: DC
Start: 2016-06-09 — End: 2016-06-09
  Filled 2016-06-08: qty 250

## 2016-06-08 MED ORDER — DEXTROSE 5 % IV SOLN
0.0000 ug/min | INTRAVENOUS | Status: DC
  Filled 2016-06-08: qty 4

## 2016-06-08 MED ORDER — CHLORHEXIDINE GLUCONATE 0.12 % MT SOLN
15.0000 mL | Freq: Once | OROMUCOSAL | Status: AC
  Administered 2016-06-09: 15 mL via OROMUCOSAL
  Filled 2016-06-08: qty 15

## 2016-06-08 MED ORDER — DEXTROSE 5 % IV SOLN
750.0000 mg | INTRAVENOUS | Status: DC
  Filled 2016-06-08: qty 750

## 2016-06-08 MED ORDER — TRANEXAMIC ACID (OHS) BOLUS VIA INFUSION
15.0000 mg/kg | INTRAVENOUS | Status: DC
  Administered 2016-06-09: 823.5 mg via INTRAVENOUS
  Filled 2016-06-08: qty 824

## 2016-06-08 MED ORDER — ALPRAZOLAM 0.25 MG PO TABS: 0.2500 mg | ORAL_TABLET | Freq: Two times a day (BID) | ORAL | Status: DC | PRN

## 2016-06-08 MED ORDER — CHLORHEXIDINE GLUCONATE 4 % EX LIQD
60.0000 mL | Freq: Once | CUTANEOUS | Status: AC
  Administered 2016-06-08: 4 via TOPICAL
  Filled 2016-06-08: qty 60

## 2016-06-08 MED ORDER — PHENYLEPHRINE HCL 10 MG/ML IJ SOLN
30.0000 ug/min | INTRAVENOUS | Status: DC
  Filled 2016-06-08: qty 2

## 2016-06-08 MED ORDER — NITROGLYCERIN 0.4 MG SL SUBL: 0.4000 mg | SUBLINGUAL_TABLET | SUBLINGUAL | Status: DC | PRN

## 2016-06-08 MED ORDER — MIDAZOLAM HCL 2 MG/2ML IJ SOLN
INTRAMUSCULAR | Status: AC
  Filled 2016-06-08: qty 2

## 2016-06-08 MED ORDER — LIDOCAINE HCL (PF) 1 % IJ SOLN
INTRAMUSCULAR | Status: DC | PRN
  Administered 2016-06-08: 5 mL via SUBCUTANEOUS

## 2016-06-08 MED ORDER — SODIUM CHLORIDE 0.9 % IV SOLN: 250.0000 mL | INTRAVENOUS | Status: DC | PRN

## 2016-06-08 MED ORDER — MIDAZOLAM HCL 2 MG/2ML IJ SOLN
INTRAMUSCULAR | Status: DC | PRN
  Administered 2016-06-08: 2 mg via INTRAVENOUS

## 2016-06-08 MED ORDER — GI COCKTAIL ~~LOC~~
30.0000 mL | Freq: Four times a day (QID) | ORAL | Status: DC | PRN
Start: 2016-06-08 — End: 2016-06-09

## 2016-06-08 MED ORDER — SODIUM CHLORIDE 0.9 % IV SOLN
250.0000 mL | INTRAVENOUS | Status: DC | PRN
  Administered 2016-06-09: 08:00:00 via INTRAVENOUS

## 2016-06-08 MED ORDER — SODIUM CHLORIDE 0.9 % WEIGHT BASED INFUSION
1.0000 mL/kg/h | INTRAVENOUS | Status: DC
  Administered 2016-06-08: 1 mL/kg/h via INTRAVENOUS

## 2016-06-08 MED ORDER — SODIUM CHLORIDE 0.9% FLUSH: 3.0000 mL | INTRAVENOUS | Status: DC | PRN

## 2016-06-08 MED ORDER — SODIUM CHLORIDE 0.9 % IV SOLN
INTRAVENOUS | Status: DC
  Administered 2016-06-09: 1 [IU]/h via INTRAVENOUS
  Filled 2016-06-08: qty 2.5

## 2016-06-08 MED ORDER — HEPARIN SODIUM (PORCINE) 1000 UNIT/ML IJ SOLN
INTRAMUSCULAR | Status: AC
  Filled 2016-06-08: qty 1

## 2016-06-08 MED ORDER — IOPAMIDOL (ISOVUE-370) INJECTION 76%
INTRAVENOUS | Status: DC | PRN
Start: 2016-06-08 — End: 2016-06-08
  Administered 2016-06-08: 75 mL via INTRA_ARTERIAL

## 2016-06-08 MED ORDER — HEPARIN (PORCINE) IN NACL 100-0.45 UNIT/ML-% IJ SOLN
850.0000 [IU]/h | INTRAMUSCULAR | Status: DC
  Administered 2016-06-08: 750 [IU]/h via INTRAVENOUS
  Filled 2016-06-08 (×2): qty 250

## 2016-06-08 MED ORDER — BISACODYL 5 MG PO TBEC
5.0000 mg | DELAYED_RELEASE_TABLET | Freq: Once | ORAL | Status: AC
  Administered 2016-06-08: 20:00:00 5 mg via ORAL
  Filled 2016-06-08: qty 1

## 2016-06-08 MED ORDER — IPRATROPIUM-ALBUTEROL 0.5-2.5 (3) MG/3ML IN SOLN
3.0000 mL | Freq: Two times a day (BID) | RESPIRATORY_TRACT | Status: DC
  Administered 2016-06-08 (×3): 3 mL via RESPIRATORY_TRACT
  Filled 2016-06-08 (×3): qty 3

## 2016-06-08 MED ORDER — IOPAMIDOL (ISOVUE-370) INJECTION 76%
INTRAVENOUS | Status: AC
  Filled 2016-06-08: qty 100

## 2016-06-08 MED ORDER — ONDANSETRON HCL 4 MG/2ML IJ SOLN: 4.0000 mg | Freq: Four times a day (QID) | INTRAMUSCULAR | Status: DC | PRN

## 2016-06-08 MED ORDER — NITROGLYCERIN IN D5W 200-5 MCG/ML-% IV SOLN
2.0000 ug/min | INTRAVENOUS | Status: DC
  Administered 2016-06-09: 10 ug/min via INTRAVENOUS
  Filled 2016-06-08: qty 250

## 2016-06-08 MED ORDER — HEPARIN BOLUS VIA INFUSION
3000.0000 [IU] | Freq: Once | INTRAVENOUS | Status: AC
  Administered 2016-06-08: 3000 [IU] via INTRAVENOUS
  Filled 2016-06-08: qty 3000

## 2016-06-08 MED ORDER — SODIUM CHLORIDE 0.9 % WEIGHT BASED INFUSION: 1.0000 mL/kg/h | INTRAVENOUS | Status: DC

## 2016-06-08 MED ORDER — VERAPAMIL HCL 2.5 MG/ML IV SOLN
INTRAVENOUS | Status: DC | PRN
  Administered 2016-06-08: 10 mL via INTRA_ARTERIAL

## 2016-06-08 MED ORDER — ASPIRIN 81 MG PO CHEW
81.0000 mg | CHEWABLE_TABLET | ORAL | Status: AC
  Administered 2016-06-08: 81 mg via ORAL
  Filled 2016-06-08: qty 1

## 2016-06-08 MED ORDER — HEPARIN (PORCINE) IN NACL 100-0.45 UNIT/ML-% IJ SOLN: 850.0000 [IU]/h | INTRAMUSCULAR | Status: DC

## 2016-06-08 MED ORDER — FENTANYL CITRATE (PF) 100 MCG/2ML IJ SOLN
INTRAMUSCULAR | Status: DC | PRN
  Administered 2016-06-08: 25 ug via INTRAVENOUS

## 2016-06-08 MED ORDER — FENTANYL CITRATE (PF) 100 MCG/2ML IJ SOLN
INTRAMUSCULAR | Status: AC
  Filled 2016-06-08: qty 2

## 2016-06-08 MED ORDER — POTASSIUM CHLORIDE 2 MEQ/ML IV SOLN
80.0000 meq | INTRAVENOUS | Status: DC
  Filled 2016-06-08: qty 40

## 2016-06-08 MED ORDER — HEPARIN SODIUM (PORCINE) 1000 UNIT/ML IJ SOLN
INTRAMUSCULAR | Status: DC | PRN
Start: 2016-06-08 — End: 2016-06-08
  Administered 2016-06-08: 3000 [IU] via INTRAVENOUS

## 2016-06-08 MED ORDER — ASPIRIN EC 81 MG PO TBEC: 81.0000 mg | DELAYED_RELEASE_TABLET | Freq: Every day | ORAL | Status: DC

## 2016-06-08 MED ORDER — SODIUM CHLORIDE 0.9 % IV SOLN
INTRAVENOUS | Status: DC
  Filled 2016-06-08: qty 1000

## 2016-06-08 MED ORDER — ATORVASTATIN CALCIUM 40 MG PO TABS: 40.0000 mg | ORAL_TABLET | Freq: Every day | ORAL | Status: DC

## 2016-06-08 MED ORDER — DIAZEPAM 5 MG PO TABS
5.0000 mg | ORAL_TABLET | ORAL | Status: DC | PRN
  Administered 2016-06-08 – 2016-06-09 (×2): 10 mg via ORAL
  Filled 2016-06-08 (×2): qty 2

## 2016-06-08 SURGICAL SUPPLY — 9 items
CATH OPTITORQUE TIG 4.0 5F (CATHETERS) ×2 IMPLANT
DEVICE RAD COMP TR BAND LRG (VASCULAR PRODUCTS) ×1 IMPLANT
GLIDESHEATH SLEND SS 6F .021 (SHEATH) ×1 IMPLANT
GUIDEWIRE INQWIRE 1.5J.035X260 (WIRE) ×1 IMPLANT
INQWIRE 1.5J .035X260CM (WIRE) ×2
KIT HEART LEFT (KITS) ×2 IMPLANT
PACK CARDIAC CATHETERIZATION (CUSTOM PROCEDURE TRAY) ×2 IMPLANT
TRANSDUCER W/STOPCOCK (MISCELLANEOUS) ×2 IMPLANT
TUBING CIL FLEX 10 FLL-RA (TUBING) ×2 IMPLANT

## 2016-06-08 NOTE — ED Notes (Signed)
Pt moved to hospital bed.

## 2016-06-08 NOTE — Progress Notes (Signed)
ANTICOAGULATION CONSULT NOTE - Initial Consult  Pharmacy Consult for Heparin  Indication: chest pain/ACS  Allergies  Allergen Reactions  . Macrobid [Nitrofurantoin Macrocrystal] Nausea Only    "ran a temperature with this medication."  . Sulfa Antibiotics Nausea Only and Other (See Comments)    " ran a temperature with it."  . Hydrocodone Nausea Only    Vital Signs: Temp: 97.6 F (36.4 C) (12/21 0756) Temp Source: Axillary (12/21 0756) BP: 111/77 (12/21 0756) Pulse Rate: 79 (12/21 0922)  Labs:  Recent Labs  06/07/16 1825 06/08/16 0150 06/08/16 0435 06/08/16 1036  HGB 13.9  --   --   --   HCT 41.2  --   --   --   PLT 510*  --   --   --   HEPARINUNFRC  --   --   --  0.18*  CREATININE 0.56  --   --   --   TROPONINI  --  0.21* 0.40*  --     CrCl cannot be calculated (Unknown ideal weight.).   Medical History: Past Medical History:  Diagnosis Date  . Anxiety   . COPD (chronic obstructive pulmonary disease) (HCC)    Assessment: 60 yo f presenting with CP  PMH: COPD  Anticoag: none PTA, hep gtt for r/o ACS. Heparin at 750 units/hr, initial lvl 0.18  CV: no cardiac hx, but likely high risk, trop 0.4, DDimer 3.44  Nephro: SCr 0.56   Heme/Onc: H&H 13.9/41.2, Plt 510  Goal of Therapy:  Heparin level 0.3-0.7 units/ml Monitor platelets by anticoagulation protocol: Yes   Plan:  -Heparin 800 units BOLUS -Increase heparin drip 850 units/hr -1800 HL -Daily CBC/HL -Monitor for bleeding -Unsure of timing of LHC  Isaac BlissMichael Gulianna Hornsby, PharmD, BCPS, BCCCP Clinical Pharmacist Clinical phone for 06/08/2016 from 7a-3:30p: [phone] If after 3:30p, please call main pharmacy at: [phone] 06/08/2016 11:14 AM

## 2016-06-08 NOTE — Progress Notes (Signed)
ANTICOAGULATION CONSULT NOTE - Initial Consult  Pharmacy Consult for Heparin  Indication: chest pain/ACS  Allergies  Allergen Reactions  . Macrobid [Nitrofurantoin Macrocrystal] Nausea Only    "ran a temperature with this medication."  . Sulfa Antibiotics Nausea Only and Other (See Comments)    " ran a temperature with it."  . Hydrocodone Nausea Only    Vital Signs: Temp: 97.8 F (36.6 C) (12/20 2007) Temp Source: Oral (12/20 2007) BP: 114/71 (12/21 0230) Pulse Rate: 82 (12/21 0230)  Labs:  Recent Labs  06/07/16 1825 06/08/16 0150  HGB 13.9  --   HCT 41.2  --   PLT 510*  --   CREATININE 0.56  --   TROPONINI  --  0.21*    CrCl cannot be calculated (Unknown ideal weight.).   Medical History: Past Medical History:  Diagnosis Date  . Anxiety   . COPD (chronic obstructive pulmonary disease) (HCC)    Assessment: 60 y/o F here with chest pain, troponin is mildly elevated at 0.21, CBC good, renal function good, D-dimer is elevated, but CT Angio is negative. PTA meds reviewed.   Goal of Therapy:  Heparin level 0.3-0.7 units/ml Monitor platelets by anticoagulation protocol: Yes   Plan:  -Heparin 3000 units BOLUS -Start heparin drip at 750 units/hr -1100 HL -Daily CBC/HL -Monitor for bleeding  Abran DukeLedford, Galina Haddox 06/08/2016,2:47 AM

## 2016-06-08 NOTE — Interval H&P Note (Signed)
History and Physical Interval Note:  06/08/2016 3:50 PM  Marie Young  has presented today for surgery, with the diagnosis of ACS/NSTEMI.   The various methods of treatment have been discussed with the patient and family. After consideration of risks, benefits and other options for treatment, the patient has consented to  Procedure(s): Left Heart Cath and Coronary Angiography (N/A) with possible Percutaneous Coronary Intervention as a surgical intervention .    The patient's history has been reviewed, patient examined, no change in status, stable for surgery.  I have reviewed the patient's chart and labs.  Questions were answered to the patient's satisfaction.    Cath Lab Visit (complete for each Cath Lab visit)  Clinical Evaluation Leading to the Procedure:   ACS: Yes.    Non-ACS:    Anginal Classification: CCS IV  Anti-ischemic medical therapy: Minimal Therapy (1 class of medications)  Non-Invasive Test Results: No non-invasive testing performed  Prior CABG: No previous CABG    Bryan Lemmaavid Harding

## 2016-06-08 NOTE — Consult Note (Signed)
 Cardiology Consult    Patient ID: Marie Young MRN: [id number], DOB/AGE: 01/12/1956   Admit date: 06/07/2016 Date of Consult: 06/08/2016  Primary Physician: No PCP Per Patient Primary Cardiologist: New Requesting Provider: Dr. Johnson Reason for Consultation: Chest pain  Patient Profile    60 yo female with PMH of tobacco use, COPD and anxiety who presented to the ED with reports of chest pain.   Past Medical History   Past Medical History:  Diagnosis Date  . Anxiety   . COPD (chronic obstructive pulmonary disease) (HCC)     Past Surgical History:  Procedure Laterality Date  . MOUTH SURGERY    . ROTATOR CUFF REPAIR       Allergies  Allergies  Allergen Reactions  . Macrobid [Nitrofurantoin Macrocrystal] Nausea Only    "ran a temperature with this medication."  . Sulfa Antibiotics Nausea Only and Other (See Comments)    " ran a temperature with it."  . Hydrocodone Nausea Only    History of Present Illness    Marie Young is a 60 yo female with PMH of tobacco use, COPD and anxiety. Denies ever having been seen by cardiology in the past. Does report family hx of CAD with sister having CABG in her 60s, brother with possible valve replacement.   Had right rotator cuff surgery back in October. States since that time she has been having intermittent right shoulder pain. Recently she has been waking up in the morning with right shoulder pain that radiates across her chest into her left arm and back of her neck. Normally pretty active and does not any any anginal symptoms. Yesterday had an argument with her daughter's boyfriend that morning. A few hours later she was riding in the car to go eat and experienced sudden onset of chest pressure/pain that was similar to previous episodes but more intense. This waxed and waned for extended period of time and prompted her to come to the ED.   In the ED her labs showed POC trop 0.02>0.04, Trop 0.21>>0.40, electrolytes were  stable, LDL 124. Ddimer 3.44. CTA was negative for PE but noted calcification in the LAD. EKG showed ST depression in anteroseptal leads on 2 occasions. She was started on IV heparin on admission. Reports some intermittent episodes of chest pressure throughout admission while at rest.   Inpatient Medications    . [START ON 06/09/2016] aspirin EC  81 mg Oral Daily  . atorvastatin  40 mg Oral q1800  . ipratropium-albuterol  3 mL Inhalation BID  . metoprolol tartrate  12.5 mg Oral BID    Family History    Family History  Problem Relation Age of Onset  . Dementia Mother   . Heart disease Sister     CABG?  age 60?  . Valvular heart disease Brother     Social History    Social History   Social History  . Marital status: Divorced    Spouse name: N/A  . Number of children: N/A  . Years of education: N/A   Occupational History  . Not on file.   Social History Main Topics  . Smoking status: Current Every Day Smoker  . Smokeless tobacco: Never Used  . Alcohol use No  . Drug use: No  . Sexual activity: Not on file   Other Topics Concern  . Not on file   Social History Narrative  . No narrative on file     Review of Systems    General:  No   chills, fever, night sweats or weight changes.  Cardiovascular:  See HPI Dermatological: No rash, lesions/masses Respiratory: No cough, dyspnea Urologic: No hematuria, dysuria Abdominal:   No nausea, vomiting, diarrhea, bright red blood per rectum, melena, or hematemesis Neurologic:  No visual changes, wkns, changes in mental status. All other systems reviewed and are otherwise negative except as noted above.  Physical Exam    Blood pressure 111/77, pulse 84, temperature 97.6 F (36.4 C), temperature source Axillary, resp. rate 19, weight 121 lb 2 oz (54.9 kg), SpO2 93 %.  General: Pleasant older WF, NAD Psych: Normal affect. Neuro: Alert and oriented X 3. Moves all extremities spontaneously. HEENT: Normal  Neck: Supple without  bruits or JVD. Lungs:  Resp regular and unlabored, CTA. Heart: RRR no s3, s4, or murmurs. Abdomen: Soft, non-tender, non-distended, BS + x 4.  Extremities: No clubbing, cyanosis or edema. DP/PT/Radials 2+ and equal bilaterally.  Labs    Troponin (Point of Care Test)  Recent Labs  06/07/16 2220  TROPIPOC 0.04    Recent Labs  06/08/16 0150 06/08/16 0435  TROPONINI 0.21* 0.40*   Lab Results  Component Value Date   WBC 13.9 (H) 06/07/2016   HGB 13.9 06/07/2016   HCT 41.2 06/07/2016   MCV 94.9 06/07/2016   PLT 510 (H) 06/07/2016    Recent Labs Lab 06/07/16 1825  NA 139  K 4.1  CL 108  CO2 22  BUN 11  CREATININE 0.56  CALCIUM 9.6  GLUCOSE 116*   Lab Results  Component Value Date   CHOL 193 06/08/2016   HDL 57 06/08/2016   LDLCALC 124 (H) 06/08/2016   TRIG 58 06/08/2016   Lab Results  Component Value Date   DDIMER 3.44 (H) 06/07/2016     Radiology Studies    Dg Chest 2 View  Result Date: 06/07/2016 CLINICAL DATA:  Right-sided chest wall spasms, intermittent for 2 months. Much worse today. EXAM: CHEST  2 VIEW COMPARISON:  None. FINDINGS: There is moderate hyperinflation. Minimal linear scarring or atelectasis in the medial left base. The lungs are otherwise clear. There is no pleural effusion. Pulmonary vasculature is normal. Heart size is normal. Hilar and mediastinal contours are unremarkable. Small hiatal hernia. IMPRESSION: Hyperinflation.  Hiatal hernia.  No acute cardiopulmonary findings. Electronically Signed   By: Daniel R Mitchell M.D.   On: 06/07/2016 19:14   Ct Angio Chest Pe W Or Wo Contrast  Result Date: 06/08/2016 CLINICAL DATA:  60-year-old female with chest pain. EXAM: CT ANGIOGRAPHY CHEST WITH CONTRAST TECHNIQUE: Multidetector CT imaging of the chest was performed using the standard protocol during bolus administration of intravenous contrast. Multiplanar CT image reconstructions and MIPs were obtained to evaluate the vascular anatomy.  CONTRAST:  85 cc Isovue 370 COMPARISON:  Chest radiograph dated 06/07/2016 FINDINGS: Cardiovascular: There is no cardiomegaly or pericardial effusion. Coronary vascular calcification involving the LAD. There is mild calcified and noncalcified plaque along the course of the thoracic aorta. There is no aneurysmal dilatation or evidence of dissection. The origins of the great vessels of the aortic arch appear patent. No CT evidence of pulmonary embolism. Mediastinum/Nodes: No hilar or mediastinal adenopathy. There is moderate size hiatal hernia containing portion of the stomach. The esophagus is grossly unremarkable. No thyroid nodules identified. Lungs/Pleura: There is mild centrilobular emphysema. Small right upper lobe anterior subpleural left. No focal consolidation, pleural effusion, or pneumothorax. Focal area of scarring noted in the lingula. The central airways are patent. Upper Abdomen: No acute abnormality. Musculoskeletal: Multilevel degenerative   changes of the spine. No acute fracture. Review of the MIP images confirms the above findings. IMPRESSION: No acute intrathoracic pathology. No CT evidence of pulmonary embolism or aortic dissection. Electronically Signed   By: Arash  Radparvar M.D.   On: 06/08/2016 00:23    ECG & Cardiac Imaging    EKG: SR with ST depression in anteroseptal leads.   Echo: None  Assessment & Plan    60 yo female with PMH of tobacco use, COPD and anxiety who presented to the ED with reports of chest pain.   1. Chest pain: Reports intermittent right shoulder pain over the past months since her rotator cuff surgery. More frequently noticing this pain in the morning with radiation across her upper chest and back of her neck. Had a sudden onset yesterday while in the car that prompted her to come to the ED. Trop with ACS trend. EKG noted ST depression in anterolateral leads. 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 Does have RF including smoking, family hx. LDL above target at 124. Hgb  A1c pending. -- echo pending -- given findings would recommend LHC to further assess coronary anatomy. Cr 0.56 prior to CTA. The patient understands that risks included but are not limited to stroke (1 in 1000), death (1 in 1000), kidney failure [usually temporary] (1 in 500), bleeding (1 in 200), allergic reaction [possibly serious] (1 in 200).   2. COPD: No acute symptoms.   3. Tobacco Use: Reports she wants to stop, has for about 4 years in the past. Plans to attempt cessation again.   4. HTN: denies any hx of but noted to be very hypertensive on arrival. Was started on low dose BB on admission.  5. Leukocyctosis: WBC elevated on admission, but has been on home prednisone.   Signed, Lindsay Roberts, NP-C Pager [phone] 06/08/2016, 9:24 AM As above, patient seen and examined. Briefly she is a 60-year-old female with past medical history of tobacco abuse, COPD and anxiety with non-ST elevation myocardial infarction. Patient is a very difficult historian. She had rotator cuff surgery in October and has had intermittent right shoulder pain radiating to her left shoulder since then. Not clearly exertional. She had similar symptoms yesterday but there was also pain in her substernal area described as "pricks". No nausea or diaphoresis but there was mild dyspnea. She was admitted and her troponin is elevated at 0.40. Electrocardiogram shows diffuse ST depression. CTA shows no pulmonary embolus.  1 non-ST elevation myocardial infarction-agent symptoms are atypical but her enzymes are abnormal as is her electrocardiogram. She has ruled in. Plan aspirin, heparin, statin and beta blocker. Proceed with cardiac catheterization. The risks and benefits including myocardial infarction, CVA and death discussed and she agrees to proceed.  2 Hyperlipidemia-increase Lipitor to 80 mg daily. Check lipids and liver in 4 weeks.  3 tobacco abuse-patient counseled on discontinuing.  Ronne Savoia, MD  

## 2016-06-08 NOTE — ED Notes (Signed)
Attempted report 

## 2016-06-08 NOTE — Progress Notes (Signed)
06/07/2016  8:51 PM  06/08/2016 8:06 AM  Marie AzureSandra K Harvie was seen and examined.  The H&P by the admitting provider , orders, imaging was reviewed.  Pt says that pain is resolved now.  On heparin infusion, cardiology service consulted.  Please see orders.  Will continue to follow.   [name]. Johnson, MD [facility]

## 2016-06-08 NOTE — Progress Notes (Signed)
Echocardiogram 2D Echocardiogram has been performed.  Marie Young 06/08/2016, 1:19 PM

## 2016-06-08 NOTE — H&P (Signed)
History and Physical  Patient Name: Marie AzureSandra K Monie     AOZ:[id number]    DOB: [birth year]    DOA: 06/07/2016 PCP: No PCP Per Patient   Patient coming from: Home     Chief Complaint: Chest pain  HPI: Marie Young is a 60 y.o. female with a past medical history significant for COPD/emphysema and smoking who presents with chest pain.  The patient had left rotator cuff repair several months ago, and since then she has had new, episodic left shoulder pain that radiates in the chest, she thinks provoked by smoking or cold air, not exertion, lasting 5-15 minutes and resolving spontaneously.  Today, she had a heated argument with a daughter's boyfriend in the morning, and a few hours later, she was still upset about it when she had sudden onset of severe chest discomfort, like pressure, radiating to the back, similar somewhat to her previous shoulder/chest pain, but much more severe.  This persisted, waxing and waning until she came to the ER. It was not associated with exertion, food, position.  It was not associated with diaphoresis, SOB, nausea.  She has had no recent leg swelling.  ED course: -Afebrile, heart rate 108, respirations 18, pulse oximetry normal, blood pressure 171/110 -Initial ECG showed sinus tachycardia and troponin was negative. -Na 139, K 4.1, Cr 0.56, WBC 13.9, Hgb 13.9 -D-dimer 3.4 -Follow-up CT angiogram showed emphysema, no PE, no pneumonia, no other intrathoracic abnormality other than calcification of coronary arteries -While in the ER, the patient complained again of some chest discomfort, and repeat ECG showed sinus rhythm rate 79, with new diffuse ST depressions -TRH was asked to admit for observation, serial troponins and risk stratification.    She's never been diagnosed with hypertension, hyperlipidemia, diabetes, heart attack or stroke. She has a sister had bypass surgery in her 6860s she believes. She is an active smoker.  Quit a few years ago for 4  years, using nicotine replacement.      Review of Systems:  Review of Systems  Constitutional: Negative for diaphoresis.  Respiratory: Negative for shortness of breath.   Cardiovascular: Positive for chest pain.  Gastrointestinal: Negative for nausea.  Psychiatric/Behavioral: The patient is nervous/anxious.   All other systems reviewed and are negative.    Past Medical History:  Diagnosis Date  . Anxiety   . COPD (chronic obstructive pulmonary disease) (HCC)     Past Surgical History:  Procedure Laterality Date  . MOUTH SURGERY    . ROTATOR CUFF REPAIR      Social History: Patient lives with family.  Patient walks unassisted.  She smokes. Does not drink alcohol. Denies illicit drugs.  Allergies  Allergen Reactions  . Macrobid [Nitrofurantoin Macrocrystal] Nausea Only    "ran a temperature with this medication."  . Sulfa Antibiotics Nausea Only and Other (See Comments)    " ran a temperature with it."  . Hydrocodone Nausea Only    Family history: family history includes Dementia in her mother; Heart disease in her sister; Valvular heart disease in her brother.  Prior to Admission medications   Medication Sig Start Date End Date Taking? Authorizing Provider  Aspirin-Salicylamide-Caffeine (BC HEADACHE POWDER PO) Take 1 packet by mouth daily as needed (for pain).   Yes Historical Provider, MD  benzonatate (TESSALON) 100 MG capsule Take 100 mg by mouth 3 (three) times daily as needed for cough. 05/20/16  Yes Historical Provider, MD  COMBIVENT RESPIMAT 20-100 MCG/ACT AERS respimat Inhale 2 puffs into the lungs 2 (  two) times daily. 05/20/16  Yes Historical Provider, MD  diazepam (VALIUM) 5 MG tablet Take 5 mg by mouth every morning. 05/09/16  Yes Historical Provider, MD  naproxen (NAPROSYN) 500 MG tablet Take 500 mg by mouth daily as needed for pain. 03/24/16  Yes Historical Provider, MD  traMADol (ULTRAM) 50 MG tablet Take 50 mg by mouth daily as needed for pain. 05/09/16  Yes  Historical Provider, MD  predniSONE (DELTASONE) 10 MG tablet Take 40 mg by mouth daily. 05/20/16   Historical Provider, MD       Physical Exam: BP 154/88   Pulse 87   Temp 97.8 F (36.6 C) (Oral)   Resp 18   SpO2 96%  General appearance: Well-developed, adult female, alert and in no acute distress.   Eyes: Anicteric, conjunctiva pink, lids and lashes normal.     ENT: No nasal deformity, discharge, or epistaxis.  OP moist without lesions.   Skin: Warm and dry.   Cardiac: RRR, nl S1-S2, no murmurs appreciated.  Capillary refill is brisk.  JVP normal.  No LE edema.  Radial and DP pulses 2+ and symmetric.  No carotid bruits. Respiratory: Normal respiratory rate and rhythm.  CTAB without rales or wheezes. GI: Abdomen soft without rigidity.  No TTP. No ascites, distension.   MSK: No deformities or effusions.   Pain not reproduced with palpation of precordium.  No pain with arm movement. Neuro: Sensorium intact and responding to questions, attention normal.  Speech is fluent.  Moves all extremities equally and with normal coordination.    Psych: Behavior appropriate.  Affect normal.  No evidence of aural or visual hallucinations or delusions.       Labs on Admission:  The metabolic panel shows Normal lites and renal function. The complete blood count shows mild leukocytosis. The initial troponin is negative, and negative on repeat. D-dimer 2.4.  Radiological Exams on Admission: Personally reviewed CXR shows emphysema, no focal opacity. CT angiogram report reviewed: Dg Chest 2 View  Result Date: 06/07/2016 CLINICAL DATA:  Right-sided chest wall spasms, intermittent for 2 months. Much worse today. EXAM: CHEST  2 VIEW COMPARISON:  None. FINDINGS: There is moderate hyperinflation. Minimal linear scarring or atelectasis in the medial left base. The lungs are otherwise clear. There is no pleural effusion. Pulmonary vasculature is normal. Heart size is normal. Hilar and mediastinal contours  are unremarkable. Small hiatal hernia. IMPRESSION: Hyperinflation.  Hiatal hernia.  No acute cardiopulmonary findings. Electronically Signed   By: Ellery Plunkaniel R Mitchell M.D.   On: 06/07/2016 19:14   Ct Angio Chest Pe W Or Wo Contrast  Result Date: 06/08/2016 CLINICAL DATA:  60 year old female with chest pain. EXAM: CT ANGIOGRAPHY CHEST WITH CONTRAST TECHNIQUE: Multidetector CT imaging of the chest was performed using the standard protocol during bolus administration of intravenous contrast. Multiplanar CT image reconstructions and MIPs were obtained to evaluate the vascular anatomy. CONTRAST:  85 cc Isovue 370 COMPARISON:  Chest radiograph dated 06/07/2016 FINDINGS: Cardiovascular: There is no cardiomegaly or pericardial effusion. Coronary vascular calcification involving the LAD. There is mild calcified and noncalcified plaque along the course of the thoracic aorta. There is no aneurysmal dilatation or evidence of dissection. The origins of the great vessels of the aortic arch appear patent. No CT evidence of pulmonary embolism. Mediastinum/Nodes: No hilar or mediastinal adenopathy. There is moderate size hiatal hernia containing portion of the stomach. The esophagus is grossly unremarkable. No thyroid nodules identified. Lungs/Pleura: There is mild centrilobular emphysema. Small right upper lobe anterior  subpleural left. No focal consolidation, pleural effusion, or pneumothorax. Focal area of scarring noted in the lingula. The central airways are patent. Upper Abdomen: No acute abnormality. Musculoskeletal: Multilevel degenerative changes of the spine. No acute fracture. Review of the MIP images confirms the above findings. IMPRESSION: No acute intrathoracic pathology. No CT evidence of pulmonary embolism or aortic dissection. Electronically Signed   By: Elgie Collard M.D.   On: 06/08/2016 00:23    EKG: Independently reviewed. Rate 116, QTc 467, sinus rhythm.  Repeat shows rate 79, Qtc 449, diffuse concave  up ST depressions.    Assessment/Plan  1. Chest pain: This is new.  The patient has HEART score of 4. Angina is atypical.  Other potential causes of chest pain (PE, dissection, pancreatitis, pneumonia/effusion, pericarditis) are doubted or have been ruled out with CTA.  Panic attack is also likely alternative from history.    -Serial troponins are ordered -Telemetry -Echocardiogram ordered, per Cardiology by phone overnight -Consult to cardiology, appreciate recommendations -Smoking cessation was recommended, specific modalities discussed, patient committed, nursing teaching ordered.   2. COPD:  -Continue combivent  3. Anxiety:  -Hold home diazepam -Alprazolam PRN overnight            DVT prophylaxis: Outpatient status, likely home today Diet: NPO after 4am for anticipated stress testing Code Status: FULL  Family Communication: Daughter at bedside  Disposition Plan: Anticipate overnight observation for arrhythmia on telemetry, serial troponins and subsequent risk stratification by Cardiology.  If testing negative, home after. Consults called: Cardiology via Inbasket overnight Admission status: Telemetry, OBS   Medical decision making: Patient seen at 1:27 AM on 06/08/2016.  The patient was discussed with Dr. Mora Bellman. What exists of the patient's chart was reviewed in depth.  Clinical condition: stable.      Alberteen Sam [facility] Pager [phone]

## 2016-06-08 NOTE — Progress Notes (Signed)
Troponin positive on repeat.  Will start heparin, BB, statin.  Full aspirin given.

## 2016-06-08 NOTE — H&P (View-Only) (Signed)
Cardiology Consult    Patient ID: Marie Young MRN: [id number], DOB/AGE: [birth year]   Admit date: 06/07/2016 Date of Consult: 06/08/2016  Primary Physician: No PCP Per Patient Primary Cardiologist: New Requesting Provider: Dr. Laural Benes Reason for Consultation: Chest pain  Patient Profile    60 yo female with PMH of tobacco use, COPD and anxiety who presented to the ED with reports of chest pain.   Past Medical History   Past Medical History:  Diagnosis Date  . Anxiety   . COPD (chronic obstructive pulmonary disease) (HCC)     Past Surgical History:  Procedure Laterality Date  . MOUTH SURGERY    . ROTATOR CUFF REPAIR       Allergies  Allergies  Allergen Reactions  . Macrobid [Nitrofurantoin Macrocrystal] Nausea Only    "ran a temperature with this medication."  . Sulfa Antibiotics Nausea Only and Other (See Comments)    " ran a temperature with it."  . Hydrocodone Nausea Only    History of Present Illness    Marie Young is a 60 yo female with PMH of tobacco use, COPD and anxiety. Denies ever having been seen by cardiology in the past. Does report family hx of CAD with sister having CABG in her 71s, brother with possible valve replacement.   Had right rotator cuff surgery back in October. States since that time she has been having intermittent right shoulder pain. Recently she has been waking up in the morning with right shoulder pain that radiates across her chest into her left arm and back of her neck. Normally pretty active and does not any any anginal symptoms. Yesterday had an argument with her daughter's boyfriend that morning. A few hours later she was riding in the car to go eat and experienced sudden onset of chest pressure/pain that was similar to previous episodes but more intense. This waxed and waned for extended period of time and prompted her to come to the ED.   In the ED her labs showed POC trop 0.02>0.04, Trop 0.21>>0.40, electrolytes were  stable, LDL 124. Ddimer 3.44. CTA was negative for PE but noted calcification in the LAD. EKG showed ST depression in anteroseptal leads on 2 occasions. She was started on IV heparin on admission. Reports some intermittent episodes of chest pressure throughout admission while at rest.   Inpatient Medications    . [START ON 06/09/2016] aspirin EC  81 mg Oral Daily  . atorvastatin  40 mg Oral q1800  . ipratropium-albuterol  3 mL Inhalation BID  . metoprolol tartrate  12.5 mg Oral BID    Family History    Family History  Problem Relation Age of Onset  . Dementia Mother   . Heart disease Sister     CABG?  age 19?  Marland Kitchen Valvular heart disease Brother     Social History    Social History   Social History  . Marital status: Divorced    Spouse name: N/A  . Number of children: N/A  . Years of education: N/A   Occupational History  . Not on file.   Social History Main Topics  . Smoking status: Current Every Day Smoker  . Smokeless tobacco: Never Used  . Alcohol use No  . Drug use: No  . Sexual activity: Not on file   Other Topics Concern  . Not on file   Social History Narrative  . No narrative on file     Review of Systems    General:  No  chills, fever, night sweats or weight changes.  Cardiovascular:  See HPI Dermatological: No rash, lesions/masses Respiratory: No cough, dyspnea Urologic: No hematuria, dysuria Abdominal:   No nausea, vomiting, diarrhea, bright red blood per rectum, melena, or hematemesis Neurologic:  No visual changes, wkns, changes in mental status. All other systems reviewed and are otherwise negative except as noted above.  Physical Exam    Blood pressure 111/77, pulse 84, temperature 97.6 F (36.4 C), temperature source Axillary, resp. rate 19, weight 121 lb 2 oz (54.9 kg), SpO2 93 %.  General: Pleasant older WF, NAD Psych: Normal affect. Neuro: Alert and oriented X 3. Moves all extremities spontaneously. HEENT: Normal  Neck: Supple without  bruits or JVD. Lungs:  Resp regular and unlabored, CTA. Heart: RRR no s3, s4, or murmurs. Abdomen: Soft, non-tender, non-distended, BS + x 4.  Extremities: No clubbing, cyanosis or edema. DP/PT/Radials 2+ and equal bilaterally.  Labs    Troponin [facility](Point of Care Test)  Recent Labs  06/07/16 2220  TROPIPOC 0.04    Recent Labs  06/08/16 0150 06/08/16 0435  TROPONINI 0.21* 0.40*   Lab Results  Component Value Date   WBC 13.9 (H) 06/07/2016   HGB 13.9 06/07/2016   HCT 41.2 06/07/2016   MCV 94.9 06/07/2016   PLT 510 (H) 06/07/2016    Recent Labs Lab 06/07/16 1825  NA 139  K 4.1  CL 108  CO2 22  BUN 11  CREATININE 0.56  CALCIUM 9.6  GLUCOSE 116*   Lab Results  Component Value Date   CHOL 193 06/08/2016   HDL 57 06/08/2016   LDLCALC 124 (H) 06/08/2016   TRIG 58 06/08/2016   Lab Results  Component Value Date   DDIMER 3.44 (H) 06/07/2016     Radiology Studies    Dg Chest 2 View  Result Date: 06/07/2016 CLINICAL DATA:  Right-sided chest wall spasms, intermittent for 2 months. Much worse today. EXAM: CHEST  2 VIEW COMPARISON:  None. FINDINGS: There is moderate hyperinflation. Minimal linear scarring or atelectasis in the medial left base. The lungs are otherwise clear. There is no pleural effusion. Pulmonary vasculature is normal. Heart size is normal. Hilar and mediastinal contours are unremarkable. Small hiatal hernia. IMPRESSION: Hyperinflation.  Hiatal hernia.  No acute cardiopulmonary findings. Electronically Signed   By: Ellery Plunkaniel R Mitchell M.D.   On: 06/07/2016 19:14   Ct Angio Chest Pe W Or Wo Contrast  Result Date: 06/08/2016 CLINICAL DATA:  60 year old female with chest pain. EXAM: CT ANGIOGRAPHY CHEST WITH CONTRAST TECHNIQUE: Multidetector CT imaging of the chest was performed using the standard protocol during bolus administration of intravenous contrast. Multiplanar CT image reconstructions and MIPs were obtained to evaluate the vascular anatomy.  CONTRAST:  85 cc Isovue 370 COMPARISON:  Chest radiograph dated 06/07/2016 FINDINGS: Cardiovascular: There is no cardiomegaly or pericardial effusion. Coronary vascular calcification involving the LAD. There is mild calcified and noncalcified plaque along the course of the thoracic aorta. There is no aneurysmal dilatation or evidence of dissection. The origins of the great vessels of the aortic arch appear patent. No CT evidence of pulmonary embolism. Mediastinum/Nodes: No hilar or mediastinal adenopathy. There is moderate size hiatal hernia containing portion of the stomach. The esophagus is grossly unremarkable. No thyroid nodules identified. Lungs/Pleura: There is mild centrilobular emphysema. Small right upper lobe anterior subpleural left. No focal consolidation, pleural effusion, or pneumothorax. Focal area of scarring noted in the lingula. The central airways are patent. Upper Abdomen: No acute abnormality. Musculoskeletal: Multilevel degenerative  changes of the spine. No acute fracture. Review of the MIP images confirms the above findings. IMPRESSION: No acute intrathoracic pathology. No CT evidence of pulmonary embolism or aortic dissection. Electronically Signed   By: Elgie CollardArash  Radparvar M.D.   On: 06/08/2016 00:23    ECG & Cardiac Imaging    EKG: SR with ST depression in anteroseptal leads.   Echo: None  Assessment & Plan    60 yo female with PMH of tobacco use, COPD and anxiety who presented to the ED with reports of chest pain.   1. Chest pain: Reports intermittent right shoulder pain over the past months since her rotator cuff surgery. More frequently noticing this pain in the morning with radiation across her upper chest and back of her neck. Had a sudden onset yesterday while in the car that prompted her to come to the ED. Trop with ACS trend. EKG noted ST depression in anterolateral leads. 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 Does have RF including smoking, family hx. LDL above target at 124. Hgb  A1c pending. -- echo pending -- given findings would recommend LHC to further assess coronary anatomy. Cr 0.56 prior to CTA. The patient understands that risks included but are not limited to stroke (1 in 1000), death (1 in 1000), kidney failure [usually temporary] (1 in 500), bleeding (1 in 200), allergic reaction [possibly serious] (1 in 200).   2. COPD: No acute symptoms.   3. Tobacco Use: Reports she wants to stop, has for about 4 years in the past. Plans to attempt cessation again.   4. HTN: denies any hx of but noted to be very hypertensive on arrival. Was started on low dose BB on admission.  5. Leukocyctosis: WBC elevated on admission, but has been on home prednisone.   Janice CoffinSigned, Lindsay Roberts, NP-C Pager [phone] 06/08/2016, 9:24 AM As above, patient seen and examined. Briefly she is a 60 year old female with past medical history of tobacco abuse, COPD and anxiety with non-ST elevation myocardial infarction. Patient is a very difficult historian. She had rotator cuff surgery in October and has had intermittent right shoulder pain radiating to her left shoulder since then. Not clearly exertional. She had similar symptoms yesterday but there was also pain in her substernal area described as "pricks". No nausea or diaphoresis but there was mild dyspnea. She was admitted and her troponin is elevated at 0.40. Electrocardiogram shows diffuse ST depression. CTA shows no pulmonary embolus.  1 non-ST elevation myocardial infarction-agent symptoms are atypical but her enzymes are abnormal as is her electrocardiogram. She has ruled in. Plan aspirin, heparin, statin and beta blocker. Proceed with cardiac catheterization. The risks and benefits including myocardial infarction, CVA and death discussed and she agrees to proceed.  2 Hyperlipidemia-increase Lipitor to 80 mg daily. Check lipids and liver in 4 weeks.  3 tobacco abuse-patient counseled on discontinuing.  Olga MillersBrian Crenshaw, MD

## 2016-06-08 NOTE — Progress Notes (Signed)
ANTICOAGULATION CONSULT NOTE - Initial Consult  Pharmacy Consult for Heparin  Indication: multivessel CAD  Allergies  Allergen Reactions  . Macrobid [Nitrofurantoin Macrocrystal] Nausea Only    "ran a temperature with this medication."  . Sulfa Antibiotics Nausea Only and Other (See Comments)    " ran a temperature with it."  . Hydrocodone Nausea Only    Vital Signs: Temp: 97.6 F (36.4 C) (12/21 1706) Temp Source: Oral (12/21 1706) BP: 130/66 (12/21 1706) Pulse Rate: 70 (12/21 1706)  Labs:  Recent Labs  06/07/16 1825 06/08/16 0150 06/08/16 0435 06/08/16 1036 06/08/16 1156  HGB 13.9  --   --   --   --   HCT 41.2  --   --   --   --   PLT 510*  --   --   --   --   LABPROT  --   --   --   --  12.8  INR  --   --   --   --  0.97  HEPARINUNFRC  --   --   --  0.18*  --   CREATININE 0.56  --   --   --  0.62  TROPONINI  --  0.21* 0.40*  --   --     Estimated Creatinine Clearance: 63.8 mL/min (by C-G formula based on SCr of 0.62 mg/dL).   Medical History: Past Medical History:  Diagnosis Date  . Anxiety   . COPD (chronic obstructive pulmonary disease) (HCC)    Assessment: 60 yo F presenting with CP, elevated troponin, and EKG changes.  Pt underwent cardiac cath today which revealed severe multivessel disease.  Pt needs CABG vs staged PCI.  TCTS consult pending.  Plans to restart heparin 8 hours after sheath removal.  Radial sheath pulled and TR band placed 12/21 at 1630.  Will order heparin to start at [facility] 12/22.  Goal of Therapy:  Heparin level 0.3-0.7 units/ml Monitor platelets by anticoagulation protocol: Yes   Plan:  At 0030, start heparin at 850 units/hr. Next Heparin level at 0830 12/22 Heparin level and CBC daily while on heparin Follow-up decision re: CABG vs staged PCI  Nov[facility] Management Services LLCKimberly Coralynn Gaona, Pharm.D., BCPS Clinical Pharmacist Pager [phone] 06/08/2016 5:28 PM

## 2016-06-08 NOTE — ED Notes (Signed)
Pt given snack. 

## 2016-06-08 NOTE — Consult Note (Signed)
[address]       [place] [address]             [phone]          CARDIOTHORACIC SURGERY CONSULTATION REPORT  PCP is No PCP Per Patient Referring Provider is Glenetta Hew MD Primary Cardiologist is Kirk Ruths, MD (new)  Reason for consultation:  Multivessel coronary artery disease with unstable angina  HPI:  Patient is a 60 year old female with no previous history of coronary artery disease but risk factors notable for history of long-standing tobacco abuse and a family history of coronary artery disease who has been referred for surgical consultation to discuss treatment options for management of severe three-vessel coronary artery disease with unstable angina pectoris, status post acute non-ST segment elevation myocardial infarction. The patient states that in retrospect she has had decreasing energy and decreased exercise tolerance for several months or more. In early October she underwent surgery for repair of a torn rotator cuff in her right shoulder.  She initially did well but within a few weeks she began to experience a dull pain that seemed to radiate from the right shoulder to her back and across her chest. She initially attributed these symptoms to her previous surgery. Symptoms came more severe. The patient has been under stress at home and has a history of anxiety, and her symptoms were subsequently attributed to anxiety. Yesterday afternoon she developed more severe substernal chest pain associated with shortness of breath. At the time she was driving her automobile and stopped to pull over. Symptoms subsided and she resume driving but the symptoms again recurred. She subsequently presented to the emergency department where initial troponin levels were weakly positive.  CT angiogram of the chest was performed and negative for pulmonary embolus but notable for presence of calcification in the left anterior descending coronary artery. On 2 separate  occasions EKGs revealed slight ST segment depression in the anteroseptal leads. She was started on intravenous heparin and admitted to the hospital. She underwent transthoracic echocardiogram and diagnostic cardiac catheterization earlier today. Catheterization reveals severe three-vessel coronary artery disease with normal left ventricular systolic function. She was referred for surgical consultation.  The patient is divorced and up until recently lived alone in [place]. Since her recent surgery she has been staying with her daughter and son-in-law. She reports no significant physical limitations prior to that of her acute illness and her recent right shoulder surgery.  She developed accelerating symptoms of bowel pressure-like pain radiating across her right chest into her back and right shoulder into the midsternal region over the last several weeks, culminating in her admission presentation. She denies any prolonged episodes of substernal chest pain lasting more than 30-45 minutes and she has not had any recurrent chest pain since hospital admission. She reports mild symptoms of exertional shortness of breath prior to her acute illness which typically occurs with only strenuous activity. She denies any history of PND, orthopnea, or lower extremity edema. She has not had palpitations, dizzy spells, or syncope.  Past Medical History:  Diagnosis Date  . Anxiety   . COPD (chronic obstructive pulmonary disease) ([place])     Past Surgical History:  Procedure Laterality Date  . MOUTH SURGERY    . ROTATOR CUFF REPAIR      Family History  Problem Relation Age of Onset  . Dementia Mother   . Heart disease Sister     CABG?  age 67?  Marland Kitchen Valvular heart disease  Brother     Social History   Social History  . Marital status: Divorced    Spouse name: N/A  . Number of children: N/A  . Years of education: N/A   Occupational History  . Not on file.   Social History Main Topics  . Smoking status:  Current Every Day Smoker  . Smokeless tobacco: Never Used  . Alcohol use No  . Drug use: No  . Sexual activity: Not on file   Other Topics Concern  . Not on file   Social History Narrative  . No narrative on file    Prior to Admission medications   Medication Sig Start Date End Date Taking? Authorizing Provider  Aspirin-Salicylamide-Caffeine (BC HEADACHE POWDER PO) Take 1 packet by mouth daily as needed (for pain).   Yes Historical Provider, MD  benzonatate (TESSALON) 100 MG capsule Take 100 mg by mouth 3 (three) times daily as needed for cough. 05/20/16  Yes Historical Provider, MD  COMBIVENT RESPIMAT 20-100 MCG/ACT AERS respimat Inhale 2 puffs into the lungs 2 (two) times daily. 05/20/16  Yes Historical Provider, MD  diazepam (VALIUM) 5 MG tablet Take 5 mg by mouth every morning. 05/09/16  Yes Historical Provider, MD  naproxen (NAPROSYN) 500 MG tablet Take 500 mg by mouth daily as needed for pain. 03/24/16  Yes Historical Provider, MD  traMADol (ULTRAM) 50 MG tablet Take 50 mg by mouth daily as needed for pain. 05/09/16  Yes Historical Provider, MD    Current Facility-Administered Medications  Medication Dose Route Frequency Provider Last Rate Last Dose  . 0.9 %  sodium chloride infusion  250 mL Intravenous PRN Leonie Man, MD      . 0.9% sodium chloride infusion  1 mL/kg/hr Intravenous Continuous Leonie Man, MD      . acetaminophen (TYLENOL) tablet 650 mg  650 mg Oral Q4H PRN Edwin Dada, MD      . ALPRAZolam Duanne Moron) tablet 0.25 mg  0.25 mg Oral BID PRN Edwin Dada, MD      . Derrill Memo ON 06/09/2016] aspirin EC tablet 81 mg  81 mg Oral Daily Edwin Dada, MD      . atorvastatin (LIPITOR) tablet 80 mg  80 mg Oral q1800 Lelon Perla, MD      . diazepam (VALIUM) tablet 5-10 mg  5-10 mg Oral Q4H PRN Rexene Alberts, MD      . gi cocktail (Maalox,Lidocaine,Donnatal)  30 mL Oral QID PRN Edwin Dada, MD      . Derrill Memo ON 06/09/2016] heparin  ADULT infusion 100 units/mL (25000 units/255m sodium chloride 0.45%)  850 Units/hr Intravenous Continuous Kimberly B Hammons, RPH      . ipratropium-albuterol (DUONEB) 0.5-2.5 (3) MG/3ML nebulizer solution 3 mL  3 mL Inhalation BID CEdwin Dada MD   3 mL at 06/08/16 09509 . metoprolol tartrate (LOPRESSOR) tablet 12.5 mg  12.5 mg Oral BID CEdwin Dada MD   12.5 mg at 06/08/16 1234  . nitroGLYCERIN (NITROSTAT) SL tablet 0.4 mg  0.4 mg Sublingual Q5 Min x 3 PRN CEdwin Dada MD      . ondansetron (ZOFRAN) injection 4 mg  4 mg Intravenous Q6H PRN CEdwin Dada MD      . sodium chloride flush (NS) 0.9 % injection 3 mL  3 mL Intravenous Q12H DLeonie Man MD      . sodium chloride flush (NS) 0.9 % injection 3 mL  3 mL Intravenous PRN  Leonie Man, MD        Allergies  Allergen Reactions  . Macrobid [Nitrofurantoin Macrocrystal] Nausea Only    "ran a temperature with this medication."  . Sulfa Antibiotics Nausea Only and Other (See Comments)    " ran a temperature with it."  . Hydrocodone Nausea Only      Review of Systems:   General:  normal appetite, decreased energy, no weight gain, no weight loss, no fever  Cardiac:  + chest pain with exertion, + chest pain at rest, + SOB with exertion, no resting SOB, no PND, no orthopnea, no palpitations, no arrhythmia, no atrial fibrillation, no LE edema, no dizzy spells, no syncope  Respiratory:  + shortness of breath, no home oxygen, + recent productive cough, no dry cough, + recent bronchitis, no wheezing, no hemoptysis, no asthma, no pain with inspiration or cough, no sleep apnea, no CPAP at night  GI:   no difficulty swallowing, no reflux, no frequent heartburn, no hiatal hernia, no abdominal pain, no constipation, no diarrhea, no hematochezia, no hematemesis, no melena  GU:   no dysuria,  no frequency, no urinary tract infection, no hematuria, no kidney stones, no kidney disease  Vascular:  no pain  suggestive of claudication, no pain in feet, no leg cramps, no varicose veins, no DVT, no non-healing foot ulcer  Neuro:   no stroke, no TIA's, no seizures, no headaches, no temporary blindness one eye,  no slurred speech, no peripheral neuropathy, no chronic pain, no instability of gait, no memory/cognitive dysfunction  Musculoskeletal: no arthritis, + joint swelling, no myalgias, no difficulty walking, normal mobility   Skin:   no rash, no itching, no skin infections, no pressure sores or ulcerations  Psych:   + anxiety, no depression, + nervousness, + unusual recent stress  Eyes:   + blurry vision, no floaters, no recent vision changes, + wears glasses but they are broken  ENT:   no hearing loss, no loose or painful teeth, partial dentures, last saw dentist within the past year  Hematologic:  no easy bruising, no abnormal bleeding, no clotting disorder, no frequent epistaxis  Endocrine:  no diabetes, does not check CBG's at home     Physical Exam:   BP 130/66   Pulse 70   Temp 97.6 F (36.4 C) (Oral)   Resp 16   Ht _0  (1.626 m)   Wt 121 lb (54.9 kg)   SpO2 95%   BMI 20.77 kg/m   General:  Thin,  well-appearing  HEENT:  Unremarkable   Neck:   no JVD, no bruits, no adenopathy   Chest:   clear to auscultation, symmetrical breath sounds, no wheezes, no rhonchi   CV:   RRR, no  murmur   Abdomen:  soft, non-tender, no masses   Extremities:  warm, well-perfused, pulses palpable, no lower extremity edema  Rectal/GU  Deferred  Neuro:   Grossly non-focal and symmetrical throughout  Skin:   Clean and dry, no rashes, no breakdown  Diagnostic Tests:  Transthoracic Echocardiography  Patient:    Marie Young, Marie Young MR #:       [id number] Study Date: 06/08/2016 Gender:     F Age:        75 Height:     162.6 cm Weight:     54.9 kg BSA:        1.57 m^2 Pt. Status: Room:   ATTENDING    Wynetta Emery, [facility], Inpatient  SONOGRAPHER  Madelin Rear, RDCS  ADMITTING     Danford, Christopher P  ORDERING     Danford, Christopher P  REFERRING    Danford, Christopher P  cc:  ------------------------------------------------------------------- LV EF: 60% -   65%  ------------------------------------------------------------------- Indications:      Chest pain 786.51.  ------------------------------------------------------------------- History:   PMH:   Chronic obstructive pulmonary disease.  ------------------------------------------------------------------- Study Conclusions  - Left ventricle: The cavity size was normal. Systolic function was   normal. The estimated ejection fraction was in the range of 60%   to 65%. Wall motion was normal; there were no regional wall   motion abnormalities. Doppler parameters are consistent with   abnormal left ventricular relaxation (grade 1 diastolic   dysfunction). Doppler parameters are consistent with   indeterminate ventricular filling pressure. - Aortic valve: There was no regurgitation. - Mitral valve: Transvalvular velocity was within the normal range.   There was no evidence for stenosis. There was no regurgitation. - Right ventricle: The cavity size was normal. Wall thickness was   normal. Systolic function was normal. - Tricuspid valve: There was no regurgitation.  ------------------------------------------------------------------- Study data:  No prior study was available for comparison.  Study status:  Routine.  Procedure:  The patient reported no pain pre or post test. Transthoracic echocardiography. Image quality was adequate.  Study completion:  There were no complications. Transthoracic echocardiography.  M-mode, complete 2D, spectral Doppler, and color Doppler.  Birthdate:  Patient birthdate: 01-Nov-1955.  Age:  Patient is 60 yr old.  Sex:  Gender: female. BMI: 20.8 kg/m^2.  Blood pressure:     111/77  Patient status: Inpatient.  Study date:  Study date: 06/08/2016. Study time:  12:52 PM.  Location:  Echo laboratory.  -------------------------------------------------------------------  ------------------------------------------------------------------- Left ventricle:  The cavity size was normal. Systolic function was normal. The estimated ejection fraction was in the range of 60% to 65%. Wall motion was normal; there were no regional wall motion abnormalities. Doppler parameters are consistent with abnormal left ventricular relaxation (grade 1 diastolic dysfunction). Doppler parameters are consistent with indeterminate ventricular filling pressure.  ------------------------------------------------------------------- Aortic valve:   Trileaflet; mildly thickened, mildly calcified leaflets. Mobility was not restricted.  Doppler:  Transvalvular velocity was within the normal range. There was no stenosis. There was no regurgitation.  ------------------------------------------------------------------- Aorta:  Aortic root: The aortic root was normal in size.  ------------------------------------------------------------------- Mitral valve:   Structurally normal valve.   Mobility was not restricted.  Doppler:  Transvalvular velocity was within the normal range. There was no evidence for stenosis. There was no regurgitation.  ------------------------------------------------------------------- Left atrium:  The atrium was normal in size.  ------------------------------------------------------------------- Right ventricle:  The cavity size was normal. Wall thickness was normal. Systolic function was normal.  ------------------------------------------------------------------- Pulmonic valve:    Structurally normal valve.   Cusp separation was normal.  Doppler:  Transvalvular velocity was within the normal range. There was no evidence for stenosis. There was  no regurgitation.  ------------------------------------------------------------------- Tricuspid valve:   Structurally normal valve.    Doppler: Transvalvular velocity was within the normal range. There was no regurgitation.  ------------------------------------------------------------------- Pulmonary artery:   The main pulmonary artery was normal-sized. Systolic pressure could not be accurately estimated.  ------------------------------------------------------------------- Right atrium:  The atrium was normal in size.  ------------------------------------------------------------------- Pericardium:  There was no pericardial effusion.  ------------------------------------------------------------------- Systemic veins: Inferior vena cava: The vessel was normal in size. The respirophasic diameter changes were in the normal range (>= 50%), consistent with normal central venous pressure.  -------------------------------------------------------------------  Measurements   Left ventricle                         Value        Reference  LV ID, ED, PLAX chordal        (L)     35.2  mm     43 - 52  LV ID, ES, PLAX chordal                23.9  mm     23 - 38  LV fx shortening, PLAX chordal         32    %      >=29  LV PW thickness, ED                    9.5   mm     ----------  IVS/LV PW ratio, ED                    0.95         <=1.3  Stroke volume, 2D                      68    ml     ----------  Stroke volume/bsa, 2D                  43    ml/m^2 ----------  LV e&', lateral                         9.06  cm/s   ----------  LV E/e&', lateral                       7.6          ----------  LV e&', medial                          5.8   cm/s   ----------  LV E/e&', medial                        11.88        ----------  LV e&', average                         7.43  cm/s   ----------  LV E/e&', average                       9.27         ----------    Ventricular septum                      Value        Reference  IVS thickness, ED                      9.06  mm     ----------    LVOT                                   Value        Reference  LVOT ID, S  20    mm     ----------  LVOT area                              3.14  cm^2   ----------  LVOT peak velocity, S                  91.6  cm/s   ----------  LVOT mean velocity, S                  63.3  cm/s   ----------  LVOT VTI, S                            21.5  cm     ----------    Aorta                                  Value        Reference  Aortic root ID, ED                     32    mm     ----------    Left atrium                            Value        Reference  LA ID, A-P, ES                         24    mm     ----------  LA ID/bsa, A-P                         1.52  cm/m^2 <=2.2  LA volume, S                           45.7  ml     ----------  LA volume/bsa, S                       29    ml/m^2 ----------  LA volume, ES, 1-p A4C                 41.7  ml     ----------  LA volume/bsa, ES, 1-p A4C             26.5  ml/m^2 ----------  LA volume, ES, 1-p A2C                 44.8  ml     ----------  LA volume/bsa, ES, 1-p A2C             28.5  ml/m^2 ----------    Mitral valve                           Value        Reference  Mitral E-wave peak velocity            68.9  cm/s   ----------  Mitral A-wave peak velocity            67.4  cm/s   ----------  Mitral deceleration time       (  H)     254   ms     150 - 230  Mitral E/A ratio, peak                 1            ----------    Right atrium                           Value        Reference  RA ID, S-I, ES, A4C                    42.3  mm     34 - 49  RA area, ES, A4C                       10.5  cm^2   8.3 - 19.5  RA volume, ES, A/L                     21.5  ml     ----------  RA volume/bsa, ES, A/L                 13.7  ml/m^2 ----------    Systemic veins                         Value        Reference  Estimated CVP                           3     mm Hg  ----------    Right ventricle                        Value        Reference  TAPSE                                  24.3  mm     ----------  RV s&', lateral, S                      11.9  cm/s   ----------  Legend: (L)  and  (H)  mark values outside specified reference range.  ------------------------------------------------------------------- Prepared and Electronically Authenticated by  Skeet Latch, MD 2017-12-21T16:16:28     Left Heart Cath and Coronary Angiography  Conclusion     LV end diastolic pressure is normal.  The left ventricular systolic function is normal. The left ventricular ejection fraction is 55-65% by visual estimate.  SEVERE MULTIVESSEL CAD:  Prox RCA lesion, 85 %stenosed. Tandem Mid RCA lesion, 90 %stenosed.  Ost LAD lesion, 40 %stenosed. Dist LAD lesion, 40 %stenosed.  Tandem Mid LAD-1 lesion, 65 %stenosed & Mid LAD-2 lesion, 60 %stenosed.  1st Diag lesion, 95 %stenosed.  2nd Mrg lesion, 99 %stenosed. TIMI 2 Flow distally would suggest that this is the Culprit Lesion.   The patient clearly has multivessel disease with severe stenoses in the proximal and mid RCA, proximal OM 2 and D1 as well as tandem moderate to severe lesions in the LAD. Difficult situation as all of these are potentially PCI targets, she deserves to have CT surgical consultation to discuss options as this is also clearly surgical disease..  We have consulted  Dr. Roxy Manns who will see the patient tonight.  Plan:  She will be transferred to [facility] post procedure unit for TR band recovery.  Restart heparin later tonight after TR band removal.  Pending her decision reference CABG versus staged multivessel PCI, would potentially consider OM and D1 PCI with FFR guided PCI of the LAD tomorrow with staged PCI of the RCA at a later date.  Smoking cessation counseling given.  We'll need high-dose statin as well his beta blocker    Glenetta Hew,  M.D., M.S. Interventional Cardiologist   Pager # [phone] Phone # [phone] [address] [place], [place] [address]    Indications   NSTEMI (non-ST elevated myocardial infarction) ([place]) [I21.4 (ICD-10-CM)]  Procedural Details/Technique   Technical Details PCP: No PCP Per Patient PRIMARY ATTENDING: Murlean Iba CARDIOLOGIST: Kirk Ruths, MD  60 year old woman without significant past medical history besides smoking presented with signs symptoms concerning for ACS/non-STEMI. She has had intermittent EKG changes suggestive of ischemia and troponin positive. She is now referred for invasive evaluation with cardiac catheterization.  Time Out: Verified patient identification, verified procedure, site/side was marked, verified correct patient position, special equipment/implants available, medications/allergies/relevent history reviewed, required imaging and test results available. Performed. Consent Signed.  Access:  RIGHT Radial Artery: 6 Fr sheath -- Seldinger technique using Angiocath Micropuncture Kit 10 mL radial cocktail IA; 3000 Units IV Heparin  Left Heart Catheterization: 5 Fr TIG 4.0 Catheter advanced or exchanged over a J-wire under direct fluoroscopic guidance into the ascending aorta - initially cross the aortic valve over wire, then pulled back after LV Gram for left and right coronary angiography. Left and Right Coronary Artery Cineangiography: TIG 4.0 Catheter  LV Hemodynamics (LV Gram): TIG 4.0 catheter  The catheter was then removed with the body over wire without complication.  Radial Sheath(s) removed in the Cath Lab with TR band placed for hemostasis.   TR Band: 1630 Hours; 15 mL air  MEDICATIONS * 25 g IV fentanyl, 2 mgIV Versed * SQ Lidocaine 37m * Radial Cocktail: 3 mg verapamil in 10 mL NS * Isovue Contrast: 75 mL * Heparin: 3000 Units   Estimated blood loss <50 mL.  During this procedure the patient was administered the  following to achieve and maintain moderate conscious sedation: Versed 2 mg, Fentanyl 50 mcg, while the patient's heart rate, blood pressure, and oxygen saturation were continuously monitored. The period of conscious sedation was 27 minutes, of which I was present face-to-face 100% of this time.    Complications   Complications documented before study signed (06/08/2016 5:16 PM EST)    No complications were associated with this study.  Documented by DLeonie Man MD - 06/08/2016 5:14 PM EST    Coronary Findings   Dominance: Right  Left Main  Vessel is large. Vessel is angiographically normal.  Left Anterior Descending  Vessel is moderate in size.  Ost LAD lesion, 40% stenosed. The lesion is tubular.  Mid LAD-1 lesion, 65% stenosed. The lesion is distal to major branch, focal and irregular.  Mid LAD-2 lesion, 60% stenosed. The lesion is focal and eccentric.  Dist LAD lesion, 40% stenosed. The lesion is tubular and smooth.  First Diagonal Branch  1st Diag lesion, 95% stenosed. The lesion is segmental, eccentric and irregular.  Lateral First Diagonal Branch  Vessel is small in size.  First Septal Branch  Vessel is small in size.  Second Septal Branch  Vessel is small in size.  Third [employer]  Vessel is small in size.  Left Circumflex  First Obtuse Marginal Branch  Vessel is small in size.  Second Obtuse Marginal Branch  2nd Mrg lesion, 99% stenosed. Culprit lesion. The lesion is distal to major branch, irregular and ulcerative. TIMI 2 flow distally  Third Obtuse Marginal Branch  Vessel is small in size.  Right Coronary Artery  Vessel is large.  Prox RCA lesion, 85% stenosed. The lesion is tubular and eccentric.  Mid RCA lesion, 90% stenosed. The lesion is tubular and eccentric.  Acute Marginal Branch  Vessel is small in size.  Right Posterior Descending Artery  Vessel is small in size. Very small caliber tandem vessels  Right Posterior Atrioventricular Branch   Vessel is moderate in size.  First Right Posterolateral  Vessel is small in size.  Second Right Posterolateral  Vessel is small in size.  Wall Motion              Left Heart   Left Ventricle The left ventricular size is normal. The left ventricular systolic function is normal. LV end diastolic pressure is normal. The left ventricular ejection fraction is 55-65% by visual estimate. No regional wall motion abnormalities. There is no evidence of mitral regurgitation.    Aortic Valve There is no aortic valve regurgitation.    Coronary Diagrams   Diagnostic Diagram     Implants     No implant documentation for this case.  PACS Images   Show images for Cardiac catheterization   Link to Procedure Log   Procedure Log    Hemo Data   Flowsheet Row Most Recent Value  AO Systolic Pressure 778 mmHg  AO Diastolic Pressure 54 mmHg  AO Mean 74 mmHg  LV Systolic Pressure 242 mmHg  LV Diastolic Pressure 2 mmHg  LV EDP 12 mmHg  Arterial Occlusion Pressure Extended Systolic Pressure 353 mmHg  Arterial Occlusion Pressure Extended Diastolic Pressure 56 mmHg  Arterial Occlusion Pressure Extended Mean Pressure 77 mmHg  Left Ventricular Apex Extended Systolic Pressure 98 mmHg  Left Ventricular Apex Extended Diastolic Pressure 1 mmHg      Impression:  Patient has severe three-vessel coronary artery disease with normal left ventricular systolic function. She presents with unstable angina pectoris with acute exacerbation of chest pain yesterday associated with weakly positive troponin levels consistent with acute non-ST segment elevation myocardial infarction. I have personally reviewed the patient's transthoracic echocardiogram and diagnostic cardiac catheterization. Echocardiogram reveals normal left ventricular systolic function. Catheterization demonstrates severe three-vessel coronary artery disease with critical stenosis in both the left circumflex and the right coronary  territories. I agree the patient would best be treated with surgical revascularization.  Plan:  I have reviewed the indications, risks, and potential benefits of coronary artery bypass grafting with the patient and her daughter.  Alternative treatment strategies have been discussed, including the relative risks, benefits and long term prognosis associated with medical therapy, percutaneous coronary intervention, and surgical revascularization.  The patient understands and accepts all potential associated risks of surgery including but not limited to risk of death, stroke or other neurologic complication, myocardial infarction, congestive heart failure, respiratory failure, renal failure, bleeding requiring blood transfusion and/or reexploration, aortic dissection or other major vascular complication, arrhythmia, heart block or bradycardia requiring permanent pacemaker, pneumonia, pleural effusion, wound infection, pulmonary embolus or other thromboembolic complication, chronic pain or other delayed complications related to median sternotomy, or the late recurrence of symptomatic ischemic heart disease and/or congestive heart failure.  The importance of long term risk modification  have been emphasized.  In particular, the patient understands how important it will be for her to quit smoking completely.  All questions answered.  We plan to proceed with coronary artery bypass grafting tomorrow morning.   I spent in excess of 120 minutes during the conduct of this hospital consultation and >50% of this time involved direct face-to-face encounter for counseling and/or coordination of the patient's care.   Valentina Gu. Roxy Manns, MD 06/08/2016 5:52 PM

## 2016-06-09 ENCOUNTER — Encounter (HOSPITAL_COMMUNITY): Payer: Self-pay | Admitting: Anesthesiology

## 2016-06-09 ENCOUNTER — Inpatient Hospital Stay (HOSPITAL_COMMUNITY): Payer: BLUE CROSS/BLUE SHIELD

## 2016-06-09 ENCOUNTER — Inpatient Hospital Stay (HOSPITAL_COMMUNITY): Payer: BLUE CROSS/BLUE SHIELD | Admitting: Anesthesiology

## 2016-06-09 ENCOUNTER — Encounter (HOSPITAL_COMMUNITY)
Admission: EM | Disposition: A | Discharge status: Home / Self Care | Payer: Self-pay | Source: Home / Self Care | Attending: Thoracic Surgery (Cardiothoracic Vascular Surgery)

## 2016-06-09 DIAGNOSIS — R071 Chest pain on breathing: Secondary | ICD-10-CM

## 2016-06-09 DIAGNOSIS — J432 Centrilobular emphysema: Secondary | ICD-10-CM

## 2016-06-09 DIAGNOSIS — Z951 Presence of aortocoronary bypass graft: Secondary | ICD-10-CM

## 2016-06-09 DIAGNOSIS — I25110 Atherosclerotic heart disease of native coronary artery with unstable angina pectoris: Secondary | ICD-10-CM

## 2016-06-09 DIAGNOSIS — Z720 Tobacco use: Secondary | ICD-10-CM

## 2016-06-09 HISTORY — PX: TEE WITHOUT CARDIOVERSION: SHX5443

## 2016-06-09 HISTORY — PX: CORONARY ARTERY BYPASS GRAFT: SHX141

## 2016-06-09 LAB — GLUCOSE, CAPILLARY
GLUCOSE-CAPILLARY: 47 mg/dL — AB (ref 65–99)
GLUCOSE-CAPILLARY: 118 mg/dL — AB (ref 65–99)
Glucose-Capillary: 106 mg/dL — ABNORMAL HIGH (ref 65–99)
Glucose-Capillary: 112 mg/dL — ABNORMAL HIGH (ref 65–99)
Glucose-Capillary: 124 mg/dL — ABNORMAL HIGH (ref 65–99)
Glucose-Capillary: 129 mg/dL — ABNORMAL HIGH (ref 65–99)
Glucose-Capillary: 141 mg/dL — ABNORMAL HIGH (ref 65–99)
Glucose-Capillary: 145 mg/dL — ABNORMAL HIGH (ref 65–99)
Glucose-Capillary: 97 mg/dL (ref 65–99)

## 2016-06-09 LAB — POCT I-STAT 4, (NA,K, GLUC, HGB,HCT)
Glucose, Bld: 99 mg/dL (ref 65–99)
HEMATOCRIT: 22 % — AB (ref 36.0–46.0)
HEMOGLOBIN: 7.5 g/dL — AB (ref 12.0–15.0)
POTASSIUM: 4.4 mmol/L (ref 3.5–5.1)
SODIUM: 140 mmol/L (ref 135–145)

## 2016-06-09 LAB — POCT I-STAT 3, ART BLOOD GAS (G3+)
ACID-BASE DEFICIT: 3 mmol/L — AB (ref 0.0–2.0)
ACID-BASE DEFICIT: 2 mmol/L (ref 0.0–2.0)
ACID-BASE DEFICIT: 2 mmol/L (ref 0.0–2.0)
ACID-BASE DEFICIT: 4 mmol/L — AB (ref 0.0–2.0)
Acid-Base Excess: 1 mmol/L (ref 0.0–2.0)
Acid-base deficit: 3 mmol/L — ABNORMAL HIGH (ref 0.0–2.0)
Acid-base deficit: 5 mmol/L — ABNORMAL HIGH (ref 0.0–2.0)
BICARBONATE: 26.2 mmol/L (ref 20.0–28.0)
BICARBONATE: 24.7 mmol/L (ref 20.0–28.0)
BICARBONATE: 23.3 mmol/L (ref 20.0–28.0)
BICARBONATE: 21 mmol/L (ref 20.0–28.0)
Bicarbonate: 26.1 mmol/L (ref 20.0–28.0)
Bicarbonate: 22.5 mmol/L (ref 20.0–28.0)
Bicarbonate: 23.9 mmol/L (ref 20.0–28.0)
Bicarbonate: 22.2 mmol/L (ref 20.0–28.0)
O2 SAT: 100 %
O2 SAT: 99 %
O2 SAT: 98 %
O2 SAT: 94 %
O2 Saturation: 100 %
O2 Saturation: 100 %
O2 Saturation: 98 %
O2 Saturation: 92 %
PCO2 ART: 40.6 mmHg (ref 32.0–48.0)
PCO2 ART: 44.1 mmHg (ref 32.0–48.0)
PH ART: 7.415 (ref 7.350–7.450)
PH ART: 7.271 — AB (ref 7.350–7.450)
PH ART: 7.298 — AB (ref 7.350–7.450)
PO2 ART: 199 mmHg — AB (ref 83.0–108.0)
PO2 ART: 133 mmHg — AB (ref 83.0–108.0)
PO2 ART: 121 mmHg — AB (ref 83.0–108.0)
PO2 ART: 82 mmHg — AB (ref 83.0–108.0)
Patient temperature: 37.3
Patient temperature: 37.7
Patient temperature: 38
TCO2: 28 mmol/L (ref 0–100)
TCO2: 27 mmol/L (ref 0–100)
TCO2: 24 mmol/L (ref 0–100)
TCO2: 25 mmol/L (ref 0–100)
TCO2: 26 mmol/L (ref 0–100)
TCO2: 25 mmol/L (ref 0–100)
TCO2: 24 mmol/L (ref 0–100)
TCO2: 22 mmol/L (ref 0–100)
pCO2 arterial: 48.9 mmHg — ABNORMAL HIGH (ref 32.0–48.0)
pCO2 arterial: 51 mmHg — ABNORMAL HIGH (ref 32.0–48.0)
pCO2 arterial: 50.5 mmHg — ABNORMAL HIGH (ref 32.0–48.0)
pCO2 arterial: 44.5 mmHg (ref 32.0–48.0)
pCO2 arterial: 46.3 mmHg (ref 32.0–48.0)
pCO2 arterial: 43.3 mmHg (ref 32.0–48.0)
pH, Arterial: 7.337 — ABNORMAL LOW (ref 7.350–7.450)
pH, Arterial: 7.316 — ABNORMAL LOW (ref 7.350–7.450)
pH, Arterial: 7.298 — ABNORMAL LOW (ref 7.350–7.450)
pH, Arterial: 7.33 — ABNORMAL LOW (ref 7.350–7.450)
pH, Arterial: 7.294 — ABNORMAL LOW (ref 7.350–7.450)
pO2, Arterial: 417 mmHg — ABNORMAL HIGH (ref 83.0–108.0)
pO2, Arterial: 315 mmHg — ABNORMAL HIGH (ref 83.0–108.0)
pO2, Arterial: 119 mmHg — ABNORMAL HIGH (ref 83.0–108.0)
pO2, Arterial: 74 mmHg — ABNORMAL LOW (ref 83.0–108.0)

## 2016-06-09 LAB — LIPID PANEL
CHOLESTEROL: 193 mg/dL (ref 0–200)
HDL: 58 mg/dL (ref >40)
LDL Cholesterol: 112 mg/dL — ABNORMAL HIGH (ref 0–99)
Total CHOL/HDL Ratio: 3.3 RATIO
Triglycerides: 115 mg/dL (ref <150)
VLDL: 23 mg/dL (ref 0–40)

## 2016-06-09 LAB — HEPARIN LEVEL (UNFRACTIONATED): Heparin Unfractionated: <0.1 IU/mL — ABNORMAL LOW (ref 0.30–0.70)

## 2016-06-09 LAB — CREATININE, SERUM
CREATININE: 0.55 mg/dL (ref 0.44–1.00)
GFR calc Af Amer: >60 mL/min (ref >60)
GFR calc non Af Amer: >60 mL/min (ref >60)

## 2016-06-09 LAB — COMPREHENSIVE METABOLIC PANEL
ALT: 12 U/L — ABNORMAL LOW (ref 14–54)
AST: 23 U/L (ref 15–41)
Albumin: 3 g/dL — ABNORMAL LOW (ref 3.5–5.0)
Alkaline Phosphatase: 69 U/L (ref 38–126)
Anion gap: 4 — ABNORMAL LOW (ref 5–15)
BILIRUBIN TOTAL: 0.5 mg/dL (ref 0.3–1.2)
BUN: 10 mg/dL (ref 6–20)
CHLORIDE: 107 mmol/L (ref 101–111)
CO2: 28 mmol/L (ref 22–32)
CREATININE: 0.68 mg/dL (ref 0.44–1.00)
Calcium: 9.3 mg/dL (ref 8.9–10.3)
GFR calc Af Amer: >60 mL/min (ref >60)
Glucose, Bld: 95 mg/dL (ref 65–99)
POTASSIUM: 4.5 mmol/L (ref 3.5–5.1)
Sodium: 139 mmol/L (ref 135–145)
TOTAL PROTEIN: 6.1 g/dL — AB (ref 6.5–8.1)

## 2016-06-09 LAB — POCT I-STAT, CHEM 8
BUN: 11 mg/dL (ref 6–20)
BUN: 10 mg/dL (ref 6–20)
BUN: 7 mg/dL (ref 6–20)
BUN: 8 mg/dL (ref 6–20)
BUN: 6 mg/dL (ref 6–20)
BUN: 7 mg/dL (ref 6–20)
BUN: 7 mg/dL (ref 6–20)
CALCIUM ION: 1.29 mmol/L (ref 1.15–1.40)
CALCIUM ION: 1.27 mmol/L (ref 1.15–1.40)
CALCIUM ION: 1.13 mmol/L — AB (ref 1.15–1.40)
CALCIUM ION: 1.26 mmol/L (ref 1.15–1.40)
CHLORIDE: 104 mmol/L (ref 101–111)
CHLORIDE: 102 mmol/L (ref 101–111)
CHLORIDE: 104 mmol/L (ref 101–111)
CREATININE: 0.4 mg/dL — AB (ref 0.44–1.00)
CREATININE: 0.4 mg/dL — AB (ref 0.44–1.00)
Calcium, Ion: 0.96 mmol/L — ABNORMAL LOW (ref 1.15–1.40)
Calcium, Ion: 1.36 mmol/L (ref 1.15–1.40)
Calcium, Ion: 1.37 mmol/L (ref 1.15–1.40)
Chloride: 101 mmol/L (ref 101–111)
Chloride: 105 mmol/L (ref 101–111)
Chloride: 105 mmol/L (ref 101–111)
Chloride: 107 mmol/L (ref 101–111)
Creatinine, Ser: 0.2 mg/dL — ABNORMAL LOW (ref 0.44–1.00)
Creatinine, Ser: 0.4 mg/dL — ABNORMAL LOW (ref 0.44–1.00)
Creatinine, Ser: 0.2 mg/dL — ABNORMAL LOW (ref 0.44–1.00)
Creatinine, Ser: 0.4 mg/dL — ABNORMAL LOW (ref 0.44–1.00)
Creatinine, Ser: 0.4 mg/dL — ABNORMAL LOW (ref 0.44–1.00)
GLUCOSE: 102 mg/dL — AB (ref 65–99)
GLUCOSE: 112 mg/dL — AB (ref 65–99)
GLUCOSE: 88 mg/dL (ref 65–99)
GLUCOSE: 119 mg/dL — AB (ref 65–99)
Glucose, Bld: 104 mg/dL — ABNORMAL HIGH (ref 65–99)
Glucose, Bld: 106 mg/dL — ABNORMAL HIGH (ref 65–99)
Glucose, Bld: 120 mg/dL — ABNORMAL HIGH (ref 65–99)
HCT: 32 % — ABNORMAL LOW (ref 36.0–46.0)
HCT: 26 % — ABNORMAL LOW (ref 36.0–46.0)
HCT: 18 % — ABNORMAL LOW (ref 36.0–46.0)
HEMATOCRIT: 24 % — AB (ref 36.0–46.0)
HEMATOCRIT: 22 % — AB (ref 36.0–46.0)
HEMATOCRIT: 22 % — AB (ref 36.0–46.0)
HEMATOCRIT: 33 % — AB (ref 36.0–46.0)
HEMOGLOBIN: 8.2 g/dL — AB (ref 12.0–15.0)
HEMOGLOBIN: 8.8 g/dL — AB (ref 12.0–15.0)
Hemoglobin: 10.9 g/dL — ABNORMAL LOW (ref 12.0–15.0)
Hemoglobin: 7.5 g/dL — ABNORMAL LOW (ref 12.0–15.0)
Hemoglobin: 6.1 g/dL — CL (ref 12.0–15.0)
Hemoglobin: 7.5 g/dL — ABNORMAL LOW (ref 12.0–15.0)
Hemoglobin: 11.2 g/dL — ABNORMAL LOW (ref 12.0–15.0)
POTASSIUM: 4.4 mmol/L (ref 3.5–5.1)
POTASSIUM: 4.2 mmol/L (ref 3.5–5.1)
POTASSIUM: 5.2 mmol/L — AB (ref 3.5–5.1)
POTASSIUM: 4.4 mmol/L (ref 3.5–5.1)
Potassium: 4.4 mmol/L (ref 3.5–5.1)
Potassium: 4.3 mmol/L (ref 3.5–5.1)
Potassium: 4.6 mmol/L (ref 3.5–5.1)
SODIUM: 139 mmol/L (ref 135–145)
SODIUM: 141 mmol/L (ref 135–145)
SODIUM: 137 mmol/L (ref 135–145)
SODIUM: 139 mmol/L (ref 135–145)
SODIUM: 139 mmol/L (ref 135–145)
Sodium: 139 mmol/L (ref 135–145)
Sodium: 140 mmol/L (ref 135–145)
TCO2: 27 mmol/L (ref 0–100)
TCO2: 25 mmol/L (ref 0–100)
TCO2: 27 mmol/L (ref 0–100)
TCO2: 27 mmol/L (ref 0–100)
TCO2: 23 mmol/L (ref 0–100)
TCO2: 25 mmol/L (ref 0–100)
TCO2: 23 mmol/L (ref 0–100)

## 2016-06-09 LAB — HEMOGLOBIN A1C
HEMOGLOBIN A1C: 6.1 % — AB (ref 4.8–5.6)
MEAN PLASMA GLUCOSE: 128 mg/dL

## 2016-06-09 LAB — PROTIME-INR
INR: 0.97
INR: 1.45
PROTHROMBIN TIME: 17.8 s — AB (ref 11.4–15.2)
Prothrombin Time: 12.8 seconds (ref 11.4–15.2)

## 2016-06-09 LAB — CBC
HCT: 35.5 % — ABNORMAL LOW (ref 36.0–46.0)
HEMATOCRIT: 38.4 % (ref 36.0–46.0)
HEMATOCRIT: 25.8 % — AB (ref 36.0–46.0)
HEMOGLOBIN: 12.3 g/dL (ref 12.0–15.0)
HEMOGLOBIN: 8.4 g/dL — AB (ref 12.0–15.0)
Hemoglobin: 11.8 g/dL — ABNORMAL LOW (ref 12.0–15.0)
MCH: 31.3 pg (ref 26.0–34.0)
MCH: 31.3 pg (ref 26.0–34.0)
MCH: 30.8 pg (ref 26.0–34.0)
MCHC: 32 g/dL (ref 30.0–36.0)
MCHC: 32.6 g/dL (ref 30.0–36.0)
MCHC: 33.2 g/dL (ref 30.0–36.0)
MCV: 97.7 fL (ref 78.0–100.0)
MCV: 96.3 fL (ref 78.0–100.0)
MCV: 92.7 fL (ref 78.0–100.0)
PLATELETS: 157 10*3/uL (ref 150–400)
Platelets: 382 10*3/uL (ref 150–400)
Platelets: 167 10*3/uL (ref 150–400)
RBC: 3.93 MIL/uL (ref 3.87–5.11)
RBC: 2.68 MIL/uL — ABNORMAL LOW (ref 3.87–5.11)
RBC: 3.83 MIL/uL — ABNORMAL LOW (ref 3.87–5.11)
RDW: 14.5 % (ref 11.5–15.5)
RDW: 14.3 % (ref 11.5–15.5)
RDW: 15.1 % (ref 11.5–15.5)
WBC: 9.6 10*3/uL (ref 4.0–10.5)
WBC: 13.1 10*3/uL — ABNORMAL HIGH (ref 4.0–10.5)
WBC: 13.7 10*3/uL — ABNORMAL HIGH (ref 4.0–10.5)

## 2016-06-09 LAB — SURGICAL PCR SCREEN
MRSA, PCR: NEGATIVE
Staphylococcus aureus: NEGATIVE

## 2016-06-09 LAB — MAGNESIUM: MAGNESIUM: 2.9 mg/dL — AB (ref 1.7–2.4)

## 2016-06-09 LAB — PLATELET COUNT: PLATELETS: 188 10*3/uL (ref 150–400)

## 2016-06-09 LAB — APTT: aPTT: 37 seconds — ABNORMAL HIGH (ref 24–36)

## 2016-06-09 LAB — HEMOGLOBIN AND HEMATOCRIT, BLOOD
HCT: 22.4 % — ABNORMAL LOW (ref 36.0–46.0)
Hemoglobin: 7.3 g/dL — ABNORMAL LOW (ref 12.0–15.0)

## 2016-06-09 LAB — PREPARE RBC (CROSSMATCH)

## 2016-06-09 SURGERY — CORONARY ARTERY BYPASS GRAFTING (CABG)
Anesthesia: General | Site: Chest

## 2016-06-09 MED ORDER — INSULIN REGULAR BOLUS VIA INFUSION
0.0000 [IU] | Freq: Three times a day (TID) | INTRAVENOUS | Status: DC
  Filled 2016-06-09: qty 10

## 2016-06-09 MED ORDER — DEXTROSE 50 % IV SOLN
INTRAVENOUS | Status: AC
  Filled 2016-06-09: qty 50

## 2016-06-09 MED ORDER — 0.9 % SODIUM CHLORIDE (POUR BTL) OPTIME
TOPICAL | Status: DC | PRN
  Administered 2016-06-09: 5000 mL

## 2016-06-09 MED ORDER — TRAMADOL HCL 50 MG PO TABS
50.0000 mg | ORAL_TABLET | ORAL | Status: DC | PRN
  Administered 2016-06-09 – 2016-06-10 (×3): 100 mg via ORAL
  Administered 2016-06-11: 50 mg via ORAL
  Administered 2016-06-11 (×2): 100 mg via ORAL
  Administered 2016-06-12: 50 mg via ORAL
  Filled 2016-06-09: qty 1
  Filled 2016-06-09 (×3): qty 2
  Filled 2016-06-09: qty 1
  Filled 2016-06-09 (×2): qty 2

## 2016-06-09 MED ORDER — SODIUM CHLORIDE 0.9 % IV SOLN
INTRAVENOUS | Status: AC
  Administered 2016-06-09: 100 mL/h via INTRAVENOUS

## 2016-06-09 MED ORDER — HEPARIN SODIUM (PORCINE) 1000 UNIT/ML IJ SOLN
INTRAMUSCULAR | Status: AC
  Filled 2016-06-09: qty 1

## 2016-06-09 MED ORDER — FENTANYL CITRATE (PF) 250 MCG/5ML IJ SOLN
INTRAMUSCULAR | Status: AC
  Filled 2016-06-09: qty 25

## 2016-06-09 MED ORDER — ARTIFICIAL TEARS OP OINT
TOPICAL_OINTMENT | OPHTHALMIC | Status: AC
  Filled 2016-06-09: qty 3.5

## 2016-06-09 MED ORDER — VECURONIUM BROMIDE 10 MG IV SOLR
INTRAVENOUS | Status: AC
  Filled 2016-06-09: qty 30

## 2016-06-09 MED ORDER — NITROGLYCERIN IN D5W 200-5 MCG/ML-% IV SOLN: 0.0000 ug/min | INTRAVENOUS | Status: DC

## 2016-06-09 MED ORDER — LACTATED RINGERS IV SOLN
INTRAVENOUS | Status: DC | PRN
  Administered 2016-06-09: 07:00:00 via INTRAVENOUS

## 2016-06-09 MED ORDER — ACETAMINOPHEN 160 MG/5ML PO SOLN: 650.0000 mg | Freq: Once | ORAL | Status: AC

## 2016-06-09 MED ORDER — OXYCODONE HCL 5 MG PO TABS
5.0000 mg | ORAL_TABLET | ORAL | Status: DC | PRN
  Filled 2016-06-09 (×2): qty 2

## 2016-06-09 MED ORDER — METOPROLOL TARTRATE 25 MG/10 ML ORAL SUSPENSION: 12.5000 mg | Freq: Two times a day (BID) | ORAL | Status: DC

## 2016-06-09 MED ORDER — DOCUSATE SODIUM 100 MG PO CAPS
200.0000 mg | ORAL_CAPSULE | Freq: Every day | ORAL | Status: DC
  Administered 2016-06-10 – 2016-06-13 (×4): 200 mg via ORAL
  Filled 2016-06-09 (×5): qty 2

## 2016-06-09 MED ORDER — PHENYLEPHRINE 40 MCG/ML (10ML) SYRINGE FOR IV PUSH (FOR BLOOD PRESSURE SUPPORT)
PREFILLED_SYRINGE | INTRAVENOUS | Status: AC
  Filled 2016-06-09: qty 10

## 2016-06-09 MED ORDER — PHENYLEPHRINE HCL 10 MG/ML IJ SOLN
INTRAVENOUS | Status: DC | PRN
  Administered 2016-06-09: 20 ug/min via INTRAVENOUS

## 2016-06-09 MED ORDER — DEXMEDETOMIDINE HCL IN NACL 200 MCG/50ML IV SOLN
0.0000 ug/kg/h | INTRAVENOUS | Status: DC
  Administered 2016-06-09: 0.7 ug/kg/h via INTRAVENOUS

## 2016-06-09 MED ORDER — VANCOMYCIN HCL IN DEXTROSE 1-5 GM/200ML-% IV SOLN
1000.0000 mg | Freq: Once | INTRAVENOUS | Status: AC
  Administered 2016-06-09: 1000 mg via INTRAVENOUS
  Filled 2016-06-09: qty 200

## 2016-06-09 MED ORDER — MIDAZOLAM HCL 10 MG/2ML IJ SOLN
INTRAMUSCULAR | Status: AC
  Filled 2016-06-09: qty 2

## 2016-06-09 MED ORDER — SODIUM CHLORIDE 0.9% FLUSH
3.0000 mL | Freq: Two times a day (BID) | INTRAVENOUS | Status: DC
  Administered 2016-06-10 – 2016-06-13 (×7): 3 mL via INTRAVENOUS

## 2016-06-09 MED ORDER — ROCURONIUM BROMIDE 50 MG/5ML IV SOSY
PREFILLED_SYRINGE | INTRAVENOUS | Status: AC
  Filled 2016-06-09: qty 5

## 2016-06-09 MED ORDER — DEXTROSE 5 % IV SOLN
0.0000 ug/min | INTRAVENOUS | Status: DC
  Administered 2016-06-09: 30 ug/min via INTRAVENOUS
  Filled 2016-06-09 (×3): qty 2

## 2016-06-09 MED ORDER — LACTATED RINGERS IV SOLN: 500.0000 mL | Freq: Once | INTRAVENOUS | Status: DC | PRN

## 2016-06-09 MED ORDER — ALBUMIN HUMAN 5 % IV SOLN
250.0000 mL | INTRAVENOUS | Status: AC | PRN
  Administered 2016-06-10: 250 mL via INTRAVENOUS

## 2016-06-09 MED ORDER — MORPHINE SULFATE (PF) 2 MG/ML IV SOLN
1.0000 mg | INTRAVENOUS | Status: DC | PRN
  Administered 2016-06-09: 2 mg via INTRAVENOUS
  Filled 2016-06-09 (×2): qty 2

## 2016-06-09 MED ORDER — ARTIFICIAL TEARS OP OINT
TOPICAL_OINTMENT | OPHTHALMIC | Status: DC | PRN
  Administered 2016-06-09: 1 via OPHTHALMIC

## 2016-06-09 MED ORDER — ORAL CARE MOUTH RINSE
15.0000 mL | Freq: Four times a day (QID) | OROMUCOSAL | Status: DC
  Administered 2016-06-09 – 2016-06-10 (×4): 15 mL via OROMUCOSAL

## 2016-06-09 MED ORDER — METOPROLOL TARTRATE 12.5 MG HALF TABLET
12.5000 mg | ORAL_TABLET | Freq: Two times a day (BID) | ORAL | Status: DC
  Administered 2016-06-10 – 2016-06-14 (×8): 12.5 mg via ORAL
  Filled 2016-06-09 (×8): qty 1

## 2016-06-09 MED ORDER — HEPARIN SODIUM (PORCINE) 1000 UNIT/ML IJ SOLN
INTRAMUSCULAR | Status: DC | PRN
  Administered 2016-06-09: 25000 [IU] via INTRAVENOUS

## 2016-06-09 MED ORDER — PROPOFOL 10 MG/ML IV BOLUS
INTRAVENOUS | Status: AC
  Filled 2016-06-09: qty 20

## 2016-06-09 MED ORDER — SODIUM CHLORIDE 0.9 % IV SOLN
INTRAVENOUS | Status: DC
  Filled 2016-06-09: qty 2.5

## 2016-06-09 MED ORDER — HEMOSTATIC AGENTS (NO CHARGE) OPTIME
TOPICAL | Status: DC | PRN
  Administered 2016-06-09: 1 via TOPICAL

## 2016-06-09 MED ORDER — CHLORHEXIDINE GLUCONATE 0.12% ORAL RINSE (MEDLINE KIT)
15.0000 mL | Freq: Two times a day (BID) | OROMUCOSAL | Status: DC
  Administered 2016-06-09 – 2016-06-10 (×3): 15 mL via OROMUCOSAL

## 2016-06-09 MED ORDER — MAGNESIUM SULFATE 4 GM/100ML IV SOLN
4.0000 g | Freq: Once | INTRAVENOUS | Status: AC
  Administered 2016-06-09: 4 g via INTRAVENOUS
  Filled 2016-06-09: qty 100

## 2016-06-09 MED ORDER — SODIUM CHLORIDE 0.9% FLUSH: 3.0000 mL | INTRAVENOUS | Status: DC | PRN

## 2016-06-09 MED ORDER — ASPIRIN EC 325 MG PO TBEC: 325.0000 mg | DELAYED_RELEASE_TABLET | Freq: Every day | ORAL | Status: DC

## 2016-06-09 MED ORDER — MIDAZOLAM HCL 2 MG/2ML IJ SOLN
2.0000 mg | INTRAMUSCULAR | Status: DC | PRN
  Filled 2016-06-09: qty 2

## 2016-06-09 MED ORDER — DEXTROSE 5 % IV SOLN
1.5000 g | Freq: Two times a day (BID) | INTRAVENOUS | Status: DC
  Administered 2016-06-09 – 2016-06-10 (×3): 1.5 g via INTRAVENOUS
  Filled 2016-06-09 (×4): qty 1.5

## 2016-06-09 MED ORDER — PROTAMINE SULFATE 10 MG/ML IV SOLN
INTRAVENOUS | Status: DC | PRN
  Administered 2016-06-09: 250 mg via INTRAVENOUS

## 2016-06-09 MED ORDER — VECURONIUM BROMIDE 10 MG IV SOLR
INTRAVENOUS | Status: AC
  Filled 2016-06-09: qty 10

## 2016-06-09 MED ORDER — SODIUM CHLORIDE 0.9 % IV SOLN
INTRAVENOUS | Status: DC
  Administered 2016-06-09: 20 mL/h via INTRAVENOUS

## 2016-06-09 MED ORDER — MIDAZOLAM HCL 5 MG/5ML IJ SOLN
INTRAMUSCULAR | Status: DC | PRN
  Administered 2016-06-09: 1 mg via INTRAVENOUS
  Administered 2016-06-09: 2 mg via INTRAVENOUS
  Administered 2016-06-09: 1 mg via INTRAVENOUS
  Administered 2016-06-09 (×3): 2 mg via INTRAVENOUS

## 2016-06-09 MED ORDER — GLYCOPYRROLATE 0.2 MG/ML IJ SOLN
INTRAMUSCULAR | Status: DC | PRN
  Administered 2016-06-09 (×2): 0.2 mg via INTRAVENOUS

## 2016-06-09 MED ORDER — ROCURONIUM BROMIDE 100 MG/10ML IV SOLN
INTRAVENOUS | Status: DC | PRN
  Administered 2016-06-09: 50 mg via INTRAVENOUS

## 2016-06-09 MED ORDER — ACETAMINOPHEN 650 MG RE SUPP
650.0000 mg | Freq: Once | RECTAL | Status: AC
  Administered 2016-06-09: 650 mg via RECTAL

## 2016-06-09 MED ORDER — ATORVASTATIN CALCIUM 80 MG PO TABS
80.0000 mg | ORAL_TABLET | Freq: Every day | ORAL | Status: DC
  Administered 2016-06-10 – 2016-06-13 (×4): 80 mg via ORAL
  Filled 2016-06-09 (×4): qty 1

## 2016-06-09 MED ORDER — PROTAMINE SULFATE 10 MG/ML IV SOLN
INTRAVENOUS | Status: AC
  Filled 2016-06-09: qty 25

## 2016-06-09 MED ORDER — LACTATED RINGERS IV SOLN: INTRAVENOUS | Status: DC

## 2016-06-09 MED ORDER — CHLORHEXIDINE GLUCONATE 0.12 % MT SOLN
15.0000 mL | OROMUCOSAL | Status: AC
  Administered 2016-06-09: 15 mL via OROMUCOSAL

## 2016-06-09 MED ORDER — ONDANSETRON HCL 4 MG/2ML IJ SOLN
4.0000 mg | Freq: Four times a day (QID) | INTRAMUSCULAR | Status: DC | PRN
  Administered 2016-06-10 – 2016-06-11 (×4): 4 mg via INTRAVENOUS
  Filled 2016-06-09 (×4): qty 2

## 2016-06-09 MED ORDER — DEXTROSE 5 % IV SOLN
INTRAVENOUS | Status: DC | PRN
  Administered 2016-06-09: 08:00:00 via INTRAVENOUS

## 2016-06-09 MED ORDER — SODIUM CHLORIDE 0.9 % IV SOLN: 250.0000 mL | INTRAVENOUS | Status: DC

## 2016-06-09 MED ORDER — FAMOTIDINE IN NACL 20-0.9 MG/50ML-% IV SOLN
20.0000 mg | Freq: Two times a day (BID) | INTRAVENOUS | Status: DC
  Administered 2016-06-09: 20 mg via INTRAVENOUS

## 2016-06-09 MED ORDER — METOPROLOL TARTRATE 5 MG/5ML IV SOLN: 2.5000 mg | INTRAVENOUS | Status: DC | PRN

## 2016-06-09 MED ORDER — ACETAMINOPHEN 160 MG/5ML PO SOLN: 1000.0000 mg | Freq: Four times a day (QID) | ORAL | Status: DC

## 2016-06-09 MED ORDER — MORPHINE SULFATE (PF) 2 MG/ML IV SOLN
1.0000 mg | INTRAVENOUS | Status: DC | PRN
  Administered 2016-06-09 (×2): 1 mg via INTRAVENOUS
  Administered 2016-06-09 – 2016-06-10 (×5): 2 mg via INTRAVENOUS
  Filled 2016-06-09 (×3): qty 1

## 2016-06-09 MED ORDER — PROPOFOL 10 MG/ML IV BOLUS
INTRAVENOUS | Status: DC | PRN
  Administered 2016-06-09: 100 mg via INTRAVENOUS

## 2016-06-09 MED ORDER — BISACODYL 5 MG PO TBEC
10.0000 mg | DELAYED_RELEASE_TABLET | Freq: Every day | ORAL | Status: DC
  Administered 2016-06-10 – 2016-06-13 (×4): 10 mg via ORAL
  Filled 2016-06-09 (×4): qty 2

## 2016-06-09 MED ORDER — SODIUM CHLORIDE 0.9 % IV SOLN
30.0000 meq | Freq: Once | INTRAVENOUS | Status: DC
  Filled 2016-06-09: qty 15

## 2016-06-09 MED ORDER — ASPIRIN 81 MG PO CHEW: 324.0000 mg | CHEWABLE_TABLET | Freq: Every day | ORAL | Status: DC

## 2016-06-09 MED ORDER — SODIUM CHLORIDE 0.45 % IV SOLN
INTRAVENOUS | Status: DC | PRN
  Administered 2016-06-09: 20 mL/h via INTRAVENOUS

## 2016-06-09 MED ORDER — SODIUM CHLORIDE 0.9 % IV SOLN
Freq: Once | INTRAVENOUS | Status: AC
  Administered 2016-06-09: 10 mL/h via INTRAVENOUS

## 2016-06-09 MED ORDER — PANTOPRAZOLE SODIUM 40 MG PO TBEC
40.0000 mg | DELAYED_RELEASE_TABLET | Freq: Every day | ORAL | Status: DC
  Administered 2016-06-11 – 2016-06-14 (×4): 40 mg via ORAL
  Filled 2016-06-09 (×4): qty 1

## 2016-06-09 MED ORDER — ACETAMINOPHEN 500 MG PO TABS
1000.0000 mg | ORAL_TABLET | Freq: Four times a day (QID) | ORAL | Status: DC
  Administered 2016-06-09 – 2016-06-14 (×17): 1000 mg via ORAL
  Filled 2016-06-09 (×18): qty 2

## 2016-06-09 MED ORDER — VANCOMYCIN HCL 1000 MG IV SOLR
INTRAVENOUS | Status: DC | PRN
  Administered 2016-06-09: 12:00:00

## 2016-06-09 MED ORDER — VECURONIUM BROMIDE 10 MG IV SOLR
INTRAVENOUS | Status: DC | PRN
  Administered 2016-06-09: 2 mg via INTRAVENOUS
  Administered 2016-06-09 (×3): 5 mg via INTRAVENOUS
  Administered 2016-06-09: 3 mg via INTRAVENOUS

## 2016-06-09 MED ORDER — FENTANYL CITRATE (PF) 250 MCG/5ML IJ SOLN
INTRAMUSCULAR | Status: DC | PRN
  Administered 2016-06-09: 150 ug via INTRAVENOUS
  Administered 2016-06-09: 100 ug via INTRAVENOUS
  Administered 2016-06-09 (×3): 250 ug via INTRAVENOUS
  Administered 2016-06-09: 100 ug via INTRAVENOUS
  Administered 2016-06-09: 150 ug via INTRAVENOUS

## 2016-06-09 MED ORDER — SODIUM CHLORIDE 0.9 % IJ SOLN
OROMUCOSAL | Status: DC | PRN
  Administered 2016-06-09: 12 mL via TOPICAL

## 2016-06-09 MED ORDER — INSULIN ASPART 100 UNIT/ML ~~LOC~~ SOLN
0.0000 [IU] | SUBCUTANEOUS | Status: DC
  Administered 2016-06-09 – 2016-06-10 (×4): 2 [IU] via SUBCUTANEOUS

## 2016-06-09 MED ORDER — ALBUMIN HUMAN 5 % IV SOLN
INTRAVENOUS | Status: DC | PRN
  Administered 2016-06-09: 08:00:00 via INTRAVENOUS

## 2016-06-09 MED ORDER — BISACODYL 10 MG RE SUPP: 10.0000 mg | Freq: Every day | RECTAL | Status: DC

## 2016-06-09 MED FILL — Mannitol IV Soln 20%: INTRAVENOUS | Qty: 500 | Status: AC

## 2016-06-09 MED FILL — Sodium Chloride IV Soln 0.9%: INTRAVENOUS | Qty: 3000 | Status: AC

## 2016-06-09 MED FILL — Heparin Sodium (Porcine) Inj 1000 Unit/ML: INTRAMUSCULAR | Qty: 20 | Status: AC

## 2016-06-09 MED FILL — Lidocaine HCl IV Inj 20 MG/ML: INTRAVENOUS | Qty: 5 | Status: AC

## 2016-06-09 MED FILL — Electrolyte-R (PH 7.4) Solution: INTRAVENOUS | Qty: 3000 | Status: AC

## 2016-06-09 MED FILL — Albumin, Human Inj 5%: INTRAVENOUS | Qty: 250 | Status: AC

## 2016-06-09 MED FILL — Calcium Chloride Inj 10%: INTRAVENOUS | Qty: 10 | Status: AC

## 2016-06-09 MED FILL — Sodium Bicarbonate IV Soln 8.4%: INTRAVENOUS | Qty: 50 | Status: AC

## 2016-06-09 SURGICAL SUPPLY — 108 items
ADH SKN CLS APL DERMABOND .7 (GAUZE/BANDAGES/DRESSINGS) ×4
BAG DECANTER FOR FLEXI CONT (MISCELLANEOUS) ×6 IMPLANT
BANDAGE ACE 4X5 VEL STRL LF (GAUZE/BANDAGES/DRESSINGS) ×3 IMPLANT
BANDAGE ACE 6X5 VEL STRL LF (GAUZE/BANDAGES/DRESSINGS) ×3 IMPLANT
BASKET HEART (ORDER IN 25'S) (MISCELLANEOUS) ×1
BASKET HEART (ORDER IN 25S) (MISCELLANEOUS) ×2 IMPLANT
BLADE 11 SAFETY STRL DISP (BLADE) ×1 IMPLANT
BLADE STERNUM SYSTEM 6 (BLADE) ×3 IMPLANT
BLADE SURG ROTATE 9660 (MISCELLANEOUS) IMPLANT
BNDG GAUZE ELAST 4 BULKY (GAUZE/BANDAGES/DRESSINGS) ×3 IMPLANT
CANISTER SUCTION 2500CC (MISCELLANEOUS) ×3 IMPLANT
CANNULA EZ GLIDE AORTIC 21FR (CANNULA) ×6 IMPLANT
CATH CPB KIT OWEN (MISCELLANEOUS) ×3 IMPLANT
CATH THORACIC 36FR (CATHETERS) ×3 IMPLANT
CLIP FOGARTY SPRING 6M (CLIP) ×1 IMPLANT
CLIP RETRACTION 3.0MM CORONARY (MISCELLANEOUS) ×1 IMPLANT
CLIP TI MEDIUM 24 (CLIP) IMPLANT
CLIP TI WIDE RED SMALL 24 (CLIP) IMPLANT
CONN ST 1/4X3/8  BEN (MISCELLANEOUS) ×2
CONN ST 1/4X3/8 BEN (MISCELLANEOUS) IMPLANT
CRADLE DONUT ADULT HEAD (MISCELLANEOUS) ×3 IMPLANT
DERMABOND ADVANCED (GAUZE/BANDAGES/DRESSINGS) ×2
DERMABOND ADVANCED .7 DNX12 (GAUZE/BANDAGES/DRESSINGS) IMPLANT
DRAIN CHANNEL 32F RND 10.7 FF (WOUND CARE) ×8 IMPLANT
DRAPE CARDIOVASCULAR INCISE (DRAPES) ×3
DRAPE INCISE IOBAN 66X45 STRL (DRAPES) ×3 IMPLANT
DRAPE SLUSH/WARMER DISC (DRAPES) ×3 IMPLANT
DRAPE SRG 135X102X78XABS (DRAPES) ×2 IMPLANT
DRSG AQUACEL AG ADV 3.5X14 (GAUZE/BANDAGES/DRESSINGS) ×2 IMPLANT
DRSG COVADERM 4X14 (GAUZE/BANDAGES/DRESSINGS) ×3 IMPLANT
ELECT BLADE 4.0 EZ CLEAN MEGAD (MISCELLANEOUS) ×6
ELECT REM PT RETURN 9FT ADLT (ELECTROSURGICAL) ×6
ELECTRODE BLDE 4.0 EZ CLN MEGD (MISCELLANEOUS) ×2 IMPLANT
ELECTRODE REM PT RTRN 9FT ADLT (ELECTROSURGICAL) ×4 IMPLANT
FELT TEFLON 1X6 (MISCELLANEOUS) ×6 IMPLANT
GAUZE SPONGE 4X4 12PLY STRL (GAUZE/BANDAGES/DRESSINGS) ×3 IMPLANT
GLOVE BIO SURGEON STRL SZ 6.5 (GLOVE) ×18 IMPLANT
GLOVE BIOGEL PI IND STRL 6.5 (GLOVE) ×1 IMPLANT
GLOVE BIOGEL PI INDICATOR 6.5 (GLOVE) ×1
GLOVE ORTHO TXT STRL SZ7.5 (GLOVE) ×6 IMPLANT
GOWN STRL REUS W/ TWL LRG LVL3 (GOWN DISPOSABLE) ×8 IMPLANT
GOWN STRL REUS W/TWL LRG LVL3 (GOWN DISPOSABLE) ×12
HEMOSTAT POWDER SURGIFOAM 1G (HEMOSTASIS) ×9 IMPLANT
INSERT FOGARTY XLG (MISCELLANEOUS) ×3 IMPLANT
KIT BASIN OR (CUSTOM PROCEDURE TRAY) ×3 IMPLANT
KIT ROOM TURNOVER OR (KITS) ×3 IMPLANT
KIT SUCTION CATH 14FR (SUCTIONS) ×9 IMPLANT
KIT VASOVIEW HEMOPRO VH 3000 (KITS) ×3 IMPLANT
LEAD PACING MYOCARDI (MISCELLANEOUS) ×3 IMPLANT
MARKER GRAFT CORONARY BYPASS (MISCELLANEOUS) ×9 IMPLANT
NS IRRIG 1000ML POUR BTL (IV SOLUTION) ×15 IMPLANT
PACK OPEN HEART (CUSTOM PROCEDURE TRAY) ×3 IMPLANT
PAD ARMBOARD 7.5X6 YLW CONV (MISCELLANEOUS) ×6 IMPLANT
PAD ELECT DEFIB RADIOL ZOLL (MISCELLANEOUS) ×3 IMPLANT
PENCIL BUTTON HOLSTER BLD 10FT (ELECTRODE) ×3 IMPLANT
PUNCH AORTIC ROT 4.0MM RCL 40 (MISCELLANEOUS) ×2 IMPLANT
PUNCH AORTIC ROTATE 4.0MM (MISCELLANEOUS) IMPLANT
PUNCH AORTIC ROTATE 4.5MM 8IN (MISCELLANEOUS) IMPLANT
PUNCH AORTIC ROTATE 5MM 8IN (MISCELLANEOUS) IMPLANT
SENSOR MYOCARDIAL TEMP (MISCELLANEOUS) ×3 IMPLANT
SET CARDIOPLEGIA MPS 5001102 (MISCELLANEOUS) ×1 IMPLANT
SOLUTION ANTI FOG 6CC (MISCELLANEOUS) ×1 IMPLANT
SPONGE GAUZE 4X4 12PLY STER LF (GAUZE/BANDAGES/DRESSINGS) ×1 IMPLANT
SPONGE LAP 18X18 X RAY DECT (DISPOSABLE) ×2 IMPLANT
SPONGE LAP 4X18 X RAY DECT (DISPOSABLE) ×1 IMPLANT
SUT BONE WAX W31G (SUTURE) ×3 IMPLANT
SUT ETHIBOND X763 2 0 SH 1 (SUTURE) ×6 IMPLANT
SUT MNCRL AB 3-0 PS2 18 (SUTURE) ×6 IMPLANT
SUT MNCRL AB 4-0 PS2 18 (SUTURE) ×1 IMPLANT
SUT PDS AB 1 CTX 36 (SUTURE) ×6 IMPLANT
SUT PROLENE 2 0 SH DA (SUTURE) IMPLANT
SUT PROLENE 3 0 SH DA (SUTURE) ×3 IMPLANT
SUT PROLENE 3 0 SH1 36 (SUTURE) IMPLANT
SUT PROLENE 4 0 RB 1 (SUTURE)
SUT PROLENE 4 0 SH DA (SUTURE) ×2 IMPLANT
SUT PROLENE 4-0 RB1 .5 CRCL 36 (SUTURE) IMPLANT
SUT PROLENE 5 0 C 1 36 (SUTURE) IMPLANT
SUT PROLENE 6 0 C 1 30 (SUTURE) ×6 IMPLANT
SUT PROLENE 7.0 RB 3 (SUTURE) ×12 IMPLANT
SUT PROLENE 8 0 BV175 6 (SUTURE) ×5 IMPLANT
SUT PROLENE BLUE 7 0 (SUTURE) ×3 IMPLANT
SUT PROLENE POLY MONO (SUTURE) ×12 IMPLANT
SUT SILK  1 MH (SUTURE) ×2
SUT SILK 1 MH (SUTURE) ×2 IMPLANT
SUT SILK 4 0 TIE 10X30 (SUTURE) ×1 IMPLANT
SUT STEEL 6MS V (SUTURE) IMPLANT
SUT STEEL STERNAL CCS#1 18IN (SUTURE) ×1 IMPLANT
SUT STEEL SZ 6 DBL 3X14 BALL (SUTURE) ×2 IMPLANT
SUT VIC AB 1 CTX 36 (SUTURE)
SUT VIC AB 1 CTX36XBRD ANBCTR (SUTURE) IMPLANT
SUT VIC AB 2-0 CT1 27 (SUTURE) ×3
SUT VIC AB 2-0 CT1 TAPERPNT 27 (SUTURE) ×1 IMPLANT
SUT VIC AB 2-0 CTX 27 (SUTURE) IMPLANT
SUT VIC AB 3-0 SH 27 (SUTURE)
SUT VIC AB 3-0 SH 27X BRD (SUTURE) IMPLANT
SUT VIC AB 3-0 X1 27 (SUTURE) IMPLANT
SUT VICRYL 4-0 PS2 18IN ABS (SUTURE) IMPLANT
SUTURE E-PAK OPEN HEART (SUTURE) ×3 IMPLANT
SYR BULB IRRIGATION 50ML (SYRINGE) ×3 IMPLANT
SYSTEM SAHARA CHEST DRAIN ATS (WOUND CARE) ×4 IMPLANT
TAPE CLOTH SURG 4X10 WHT LF (GAUZE/BANDAGES/DRESSINGS) ×1 IMPLANT
TAPE PAPER 3X10 WHT MICROPORE (GAUZE/BANDAGES/DRESSINGS) ×2 IMPLANT
TOWEL OR 17X24 6PK STRL BLUE (TOWEL DISPOSABLE) ×6 IMPLANT
TOWEL OR 17X26 10 PK STRL BLUE (TOWEL DISPOSABLE) ×6 IMPLANT
TRAY FOLEY IC TEMP SENS 16FR (CATHETERS) ×3 IMPLANT
TUBING INSUFFLATION (TUBING) ×3 IMPLANT
UNDERPAD 30X30 (UNDERPADS AND DIAPERS) ×3 IMPLANT
WATER STERILE IRR 1000ML POUR (IV SOLUTION) ×6 IMPLANT

## 2016-06-09 NOTE — Op Note (Signed)
CARDIOTHORACIC SURGERY OPERATIVE NOTE  Date of Procedure: 06/09/2016  Preoperative Diagnosis:   Severe 3-vessel Coronary Artery Disease  Unstable Angina Pectoris  S/P Acute Non-ST Segment Elevation Myocardial Infarction  Postoperative Diagnosis: Same  Procedure:    Coronary Artery Bypass Grafting x 4   Left Internal Mammary Artery to Distal Left Anterior Descending Coronary Artery  Free Right Internal Mammary Artery to Diagonal Branch Coronary Artery  Saphenous Vein Graft to Distal Right Coronary Artery  Saphenous Vein Graft to First Obtuse Marginal Branch of Left Circumflex Coronary Artery  Endoscopic Vein Harvest from Right Thigh and Lower Leg  Surgeon: Salvatore Decentlarence H. Cornelius Moraswen, MD  Assistant: Lowella DandyErin Barrett, PA-C  Anesthesia: Achille RichAdam Hodierne, MD  Operative Findings:  Normal LV systolic function  Good quality LIMA and RIMA conduit for grafting  Good quality SVG conduit for grafting  Good quality target vessels for grafting     BRIEF CLINICAL NOTE AND INDICATIONS FOR SURGERY  Patient is a 60 year old female with no previous history of coronary artery disease but risk factors notable for history of long-standing tobacco abuse and a family history of coronary artery disease who has been referred for surgical consultation to discuss treatment options for management of severe three-vessel coronary artery disease with unstable angina pectoris, status post acute non-ST segment elevation myocardial infarction. The patient states that in retrospect she has had decreasing energy and decreased exercise tolerance for several months or more. In early October she underwent surgery for repair of a torn rotator cuff in her right shoulder.  She initially did well but within a few weeks she began to experience a dull pain that seemed to radiate from the right shoulder to her back and across her chest. She initially attributed these symptoms to her previous surgery. Symptoms came more severe. The  patient has been under stress at home and has a history of anxiety, and her symptoms were subsequently attributed to anxiety. Yesterday afternoon she developed more severe substernal chest pain associated with shortness of breath. At the time she was driving her automobile and stopped to pull over. Symptoms subsided and she resume driving but the symptoms again recurred. She subsequently presented to the emergency department where initial troponin levels were weakly positive.  CT angiogram of the chest was performed and negative for pulmonary embolus but notable for presence of calcification in the left anterior descending coronary artery. On 2 separate occasions EKGs revealed slight ST segment depression in the anteroseptal leads. She was started on intravenous heparin and admitted to the hospital. She underwent transthoracic echocardiogram and diagnostic cardiac catheterization earlier today. Catheterization reveals severe three-vessel coronary artery disease with normal left ventricular systolic function. She was referred for surgical consultation.  The patient has been seen in consultation and counseled at length regarding the indications, risks and potential benefits of surgery.  All questions have been answered, and the patient provides full informed consent for the operation as described.    DETAILS OF THE OPERATIVE PROCEDURE  Preparation:  The patient is brought to the operating room on the above mentioned date and central monitoring was established by the anesthesia team including placement of Swan-Ganz catheter and radial arterial line. The patient is placed in the supine position on the operating table.  Intravenous antibiotics are administered. General endotracheal anesthesia is induced uneventfully. A Foley catheter is placed.  Baseline transesophageal echocardiogram was performed.  Findings were notable for normal LV function  The patient's chest, abdomen, both groins, and both lower  extremities are prepared and draped  in a sterile manner. A time out procedure is performed.   Surgical Approach and Conduit Harvest:  A median sternotomy incision was performed and both the left and right internal mammary arteries were dissected from the chest wall and prepared for bypass grafting. Both internal mammary arteries were notably good quality conduits. Simultaneously, the greater saphenous vein is obtained from the patient's right thigh and upper leg using endoscopic vein harvest technique. The saphenous vein is notably good quality conduit. After removal of the saphenous vein, the small surgical incisions in the lower extremity are closed with absorbable suture. Following systemic heparinization, the both internal mammary arteries were transected distally noted to have excellent flow.   Extracorporeal Cardiopulmonary Bypass and Myocardial Protection:  The pericardium is opened. The ascending aorta is normal in appearance. The ascending aorta and the right atrium are cannulated for cardiopulmonary bypass.  Adequate heparinization is verified.     The entire pre-bypass portion of the operation was notable for stable hemodynamics.  Cardiopulmonary bypass was begun and the surface of the heart is inspected. Distal target vessels are selected for coronary artery bypass grafting. The right internal mammary artery was notably too short to be utilized as an in-situ graft.  The right internal mammary artery was transected proximally just after its takeoff from the right subclavian artery and the proximal stump was doubly ligated.  A cardioplegia cannula is placed in the ascending aorta.  A temperature probe was placed in the interventricular septum.  The patient is allowed to cool passively to [facility]32C systemic temperature.  The aortic cross clamp is applied and cold blood cardioplegia is delivered initially in an antegrade fashion through the aortic root.  Iced saline slush is applied for topical  hypothermia.  The initial cardioplegic arrest is rapid with early diastolic arrest.  Repeat doses of cardioplegia are administered intermittently throughout the entire cross clamp portion of the operation through the aortic root and through subsequently placed vein grafts in order to maintain completely flat electrocardiogram and septal myocardial temperature below 15C.  Myocardial protection was felt to be excellent.  Coronary Artery Bypass Grafting:   The distal right coronary artery was grafted using a reversed saphenous vein graft in an end-to-side fashion.  At the site of distal anastomosis the target vessel was good quality and measured approximately 2.2 mm in diameter.  The first obtuse marginal branch of the left circumflex coronary artery was grafted using a reversed saphenous vein graft in an end-to-side fashion.  At the site of distal anastomosis the target vessel was good quality and measured approximately 2.2 mm in diameter.  The diagonal branch of the left anterior descending coronary artery was grafted using the right internal mammary artery as as free graft in an end-to-side fashion.  At the site of distal anastomosis the target vessel was good quality and measured approximately 1.3 mm in diameter.  The distal left anterior coronary artery was grafted with the left internal mammary artery in an end-to-side fashion.  At the site of distal anastomosis the target vessel was fair quality and measured approximately 1.3 mm in diameter.  Both proximal vein graft anastomoses were placed directly to the ascending aorta prior to removal of the aortic cross clamp.  The proximal end of the free right internal mammary artery graft was sewn to the ascending aorta using a short interposition segment of reversed greater saphenous vein.  The septal myocardial temperature rose rapidly after reperfusion of the left internal mammary artery graft.  The aortic cross clamp  was removed after a total cross  clamp time of 78 minutes.     Procedure Completion:  All proximal and distal coronary anastomoses were inspected for hemostasis and appropriate graft orientation. Epicardial pacing wires are fixed to the right ventricular outflow tract and to the right atrial appendage. The patient is rewarmed to 37C temperature. The patient is weaned and disconnected from cardiopulmonary bypass.  The patient's rhythm at separation from bypass was sinus.  The patient was weaned from cardiopulmonary bypass without any inotropic support. Total cardiopulmonary bypass time for the operation was 113 minutes.  Followup transesophageal echocardiogram performed after separation from bypass revealed no changes from the preoperative exam.  The aortic and venous cannula were removed uneventfully. Protamine was administered to reverse the anticoagulation. The mediastinum and pleural space were inspected for hemostasis and irrigated with saline solution. The mediastinum and both pleural spaces were drained using 4 chest tubes placed through separate stab incisions inferiorly.  The soft tissues anterior to the aorta were reapproximated loosely. The sternum is closed with double strength sternal wire. The soft tissues anterior to the sternum were closed in multiple layers and the skin is closed with a running subcuticular skin closure.  The post-bypass portion of the operation was notable for stable rhythm and hemodynamics.  No blood products were administered during the operation.   Disposition:  The patient tolerated the procedure well and is transported to the surgical intensive care in stable condition. There are no intraoperative complications. All sponge instrument and needle counts are verified correct at completion of the operation.    Salvatore Decent. Cornelius Moras MD 06/09/2016 1:12 PM

## 2016-06-09 NOTE — Progress Notes (Signed)
Pt stated that her partial upper plate was removed in the OR - after contacting the OR and pt's daughter, it has been confirmed that her daughter has the plate with her, she will bring it to the pt tomorrow morning. Pt updated and agrees with the plan.

## 2016-06-09 NOTE — Anesthesia Procedure Notes (Signed)
Procedure Name: Intubation Date/Time: 06/09/2016 7:59 AM Performed by: Wray KearnsFOLEY, Ruhan Borak A Pre-anesthesia Checklist: Patient identified, Emergency Drugs available, Suction available and Patient being monitored Patient Re-evaluated:Patient Re-evaluated prior to inductionOxygen Delivery Method: Circle System Utilized and Circle system utilized Preoxygenation: Pre-oxygenation with 100% oxygen Intubation Type: IV induction and Cricoid Pressure applied Ventilation: Mask ventilation without difficulty Laryngoscope Size: Mac and 3 Grade View: Grade I Tube type: Oral Tube size: 8.0 mm Number of attempts: 1 Airway Equipment and Method: Stylet Placement Confirmation: ETT inserted through vocal cords under direct vision,  positive ETCO2 and breath sounds checked- equal and bilateral Secured at: 22 cm Tube secured with: Tape Dental Injury: Teeth and Oropharynx as per pre-operative assessment

## 2016-06-09 NOTE — Progress Notes (Signed)
Patient transported to OR in stable condition with family at his side. Report given to CRNA and labelled patient belongings taken to surgical ICU.

## 2016-06-09 NOTE — Progress Notes (Signed)
[facility] BRIEF SICU PROGRESS NOTE  Day of Surgery  S/P Procedure(s) (LRB): CORONARY ARTERY BYPASS GRAFTING x 4 -LIMA to LAD -FREE to DIAGONAL -SVG to OM 1 -SVG to RCA  UTILIZING BILATERAL INTERNAL MAMMARY ARTERY AND ENDOSCOPICALLY HARVESTED RIGHT SAPHENEOUS VEIN (N/A) TRANSESOPHAGEAL ECHOCARDIOGRAM (TEE) (N/A)   Starting to wake on vent.  Follows commands NSR w/ stable hemodynamics, no drips O2 sats 100% Chest tube output low UOP excellent Labs okay  Plan: Continue routine early postop  Marie Nailslarence H Owen, MD 06/09/2016 5:38 PM

## 2016-06-09 NOTE — Progress Notes (Signed)
Initiated Open Heart Rapid Wean per protocol. Pt on RR of 20 per previous ABG, decreased to 12 to begin wean protocol. RN aware. RT will continue to monitor.

## 2016-06-09 NOTE — Procedures (Signed)
Extubation Procedure Note Pt completed Rapid Wean protocol. Pt follows all commands. NIF -28, VC 750. + Cuff Leak, No stridor post extubation. Pt on 4L [place]. Pt speaks name/location. Pt tolerating extubation well at this time   Patient Details:   Name: Marie AzureSandra K Schuld DOB: [birth year] MRN: [id number]   Airway Documentation:     Evaluation  O2 sats: stable throughout Complications: No apparent complications Patient did tolerate procedure well. Bilateral Breath Sounds: Clear, Diminished   Yes  Elmer PickerClifton, Rui Wordell D 06/09/2016, 8:02 PM

## 2016-06-09 NOTE — Anesthesia Procedure Notes (Signed)
Central Venous Catheter Insertion Performed by: Shelton SilvasHOLLIS, Jahnasia Tatum D, anesthesiologist Start/End12/22/2017 6:50 AM, 06/09/2016 6:55 AM Patient location: Pre-op. Preanesthetic checklist: patient identified, IV checked, site marked, risks and benefits discussed, surgical consent, monitors and equipment checked, pre-op evaluation, timeout performed and anesthesia consent Position: Trendelenburg Lidocaine 1% used for infiltration and patient sedated Hand hygiene performed , maximum sterile barriers used  and Seldinger technique used Catheter size: 8.5 Fr Total catheter length 10. Central line was placed.Sheath introducer Swan type:thermodilution PA Cath depth:50 Procedure performed using ultrasound guided technique. Ultrasound Notes:anatomy identified, needle tip was noted to be adjacent to the nerve/plexus identified, no ultrasound evidence of intravascular and/or intraneural injection and image(s) printed for medical record Attempts: 1 Following insertion, line sutured and dressing applied. Post procedure assessment: blood return through all ports, free fluid flow and no air  Patient tolerated the procedure well with no immediate complications.
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
## 2016-06-09 NOTE — Brief Op Note (Addendum)
06/07/2016 - 06/09/2016  11:43 AM  PATIENT:  Marie AzureSandra K Young  60 y.o. female  PRE-OPERATIVE DIAGNOSIS:  coronary artery disease  POST-OPERATIVE DIAGNOSIS:  coronary artery disease  PROCEDURE:  Procedure(s):  CORONARY ARTERY BYPASS GRAFTING x 4 -LIMA to LAD -FREE to DIAGONAL -SVG to OM 1 -SVG to RCA  TRANSESOPHAGEAL ECHOCARDIOGRAM (TEE) (N/A)  SURGEON:    Purcell Nailslarence H Owen, MD  ASSISTANTS:  Lowella DandyErin Barrett, PA-C  ANESTHESIA:   Achille RichAdam Hodierne, MD  CROSSCLAMP TIME:   2478'  CARDIOPULMONARY BYPASS TIME: 113'  FINDINGS:  Normal LV systolic function  Good quality LIMA and RIMA conduit for grafting  Good quality SVG conduit for grafting  Good quality target vessels for grafting  COMPLICATIONS: None  BASELINE WEIGHT: 55 kg  PATIENT DISPOSITION:   TO SICU IN STABLE CONDITION  Purcell Nailslarence H Owen, MD 06/09/2016 1:05 PM

## 2016-06-09 NOTE — Progress Notes (Signed)
    The patient was admitted for an NSTEMI and underwent cardiac catheterization on 06/08/2016 which showed severe multivessel CAD with 85% prox-RCA stenosis, 90% mid-RCA stenosis, 40% Ost and distal LAD stenosis, 95% 1st Diag stenosis, and 99% 2nd Mrg stenosis. CT Surgery consulted and she is for CABG today with Dr. Cornelius Moraswen. Cardiology will resume following tomorrow.   Signed, Ellsworth LennoxBrittany M Sahasra Belue, PA-C 06/09/2016, 7:22 AM

## 2016-06-09 NOTE — OR Nursing (Signed)
SICU Calls:   First call at 1232 - 45 mins Carollee Herter( [place]); second call at 1300 - 20 mins Carollee Herter( [place]).          Frances MaywoodLeonila Bradi Arbuthnot,RN

## 2016-06-09 NOTE — Anesthesia Preprocedure Evaluation (Signed)
Anesthesia Evaluation  Patient identified by MRN, date of birth, ID band Patient awake    Reviewed: Allergy & Precautions, H&P , NPO status , Patient's Chart, lab work & pertinent test results  Airway Mallampati: II   Neck ROM: full    Dental   Pulmonary COPD, Current Smoker,    breath sounds clear to auscultation       Cardiovascular + angina + CAD and + Past MI   Rhythm:regular Rate:Normal  Cath (06/08/16): multivessel severe CAD.  Normal LV function.   Neuro/Psych  Headaches, PSYCHIATRIC DISORDERS Anxiety Depression    GI/Hepatic   Endo/Other    Renal/GU      Musculoskeletal  (+) Arthritis ,   Abdominal   Peds  Hematology   Anesthesia Other Findings   Reproductive/Obstetrics                             Anesthesia Physical Anesthesia Plan  ASA: III  Anesthesia Plan: General   Post-op Pain Management:    Induction: Intravenous  Airway Management Planned: Oral ETT  Additional Equipment: Arterial line, CVP, PA Cath, TEE and Ultrasound Guidance Line Placement  Intra-op Plan:   Post-operative Plan: Post-operative intubation/ventilation  Informed Consent: I have reviewed the patients History and Physical, chart, labs and discussed the procedure including the risks, benefits and alternatives for the proposed anesthesia with the patient or authorized representative who has indicated his/her understanding and acceptance.     Plan Discussed with: CRNA, Anesthesiologist and Surgeon  Anesthesia Plan Comments:         Anesthesia Quick Evaluation

## 2016-06-09 NOTE — Anesthesia Postprocedure Evaluation (Signed)
Anesthesia Post Note  Patient: Marie AzureSandra K Priego  Procedure(s) Performed: Procedure(s) (LRB): CORONARY ARTERY BYPASS GRAFTING x 4 -LIMA to LAD -FREE to DIAGONAL -SVG to OM 1 -SVG to RCA  UTILIZING BILATERAL INTERNAL MAMMARY ARTERY AND ENDOSCOPICALLY HARVESTED RIGHT SAPHENEOUS VEIN (N/A) TRANSESOPHAGEAL ECHOCARDIOGRAM (TEE) (N/A)  Patient location during evaluation: ICU Anesthesia Type: General Level of consciousness: sedated and patient remains intubated per anesthesia plan Pain management: pain level controlled Vital Signs Assessment: post-procedure vital signs reviewed and stable Respiratory status: patient on ventilator - see flowsheet for VS and patient remains intubated per anesthesia plan Cardiovascular status: stable Anesthetic complications: no       Last Vitals:  Vitals:   06/09/16 1530 06/09/16 1545  BP: 100/72   Pulse: 80 80  Resp: (!) 23 20  Temp: (!) 36 C 36.2 C    Last Pain:  Vitals:   06/09/16 1445  TempSrc: Core (Comment)  PainSc:                  Gervis Gaba S

## 2016-06-09 NOTE — Transfer of Care (Signed)
Immediate Anesthesia Transfer of Care Note  Patient: Marie AzureSandra K Young  Procedure(s) Performed: Procedure(s): CORONARY ARTERY BYPASS GRAFTING x 4 -LIMA to LAD -FREE to DIAGONAL -SVG to OM 1 -SVG to RCA  UTILIZING BILATERAL INTERNAL MAMMARY ARTERY AND ENDOSCOPICALLY HARVESTED RIGHT SAPHENEOUS VEIN (N/A) TRANSESOPHAGEAL ECHOCARDIOGRAM (TEE) (N/A)  Patient Location: SICU  Anesthesia Type:General  Level of Consciousness: sedated and Patient remains intubated per anesthesia plan  Airway & Oxygen Therapy: Patient remains intubated per anesthesia plan and Patient placed on Ventilator (see vital sign flow sheet for setting)  Post-op Assessment: Report given to RN and Post -op Vital signs reviewed and stable  Post vital signs: Reviewed and stable  Last Vitals:  Vitals:   06/09/16 0442 06/09/16 0450  BP: 109/70 109/70  Pulse: 77 75  Resp: 15   Temp: 36.2 C     Last Pain:  Vitals:   06/09/16 0442  TempSrc: Axillary  PainSc:          Complications: No apparent anesthesia complications

## 2016-06-10 ENCOUNTER — Inpatient Hospital Stay (HOSPITAL_COMMUNITY): Payer: BLUE CROSS/BLUE SHIELD

## 2016-06-10 LAB — GLUCOSE, CAPILLARY
GLUCOSE-CAPILLARY: 123 mg/dL — AB (ref 65–99)
GLUCOSE-CAPILLARY: 126 mg/dL — AB (ref 65–99)
GLUCOSE-CAPILLARY: 86 mg/dL (ref 65–99)
GLUCOSE-CAPILLARY: 96 mg/dL (ref 65–99)
Glucose-Capillary: 79 mg/dL (ref 65–99)
Glucose-Capillary: 132 mg/dL — ABNORMAL HIGH (ref 65–99)
Glucose-Capillary: 103 mg/dL — ABNORMAL HIGH (ref 65–99)
Glucose-Capillary: 122 mg/dL — ABNORMAL HIGH (ref 65–99)

## 2016-06-10 LAB — CBC
HCT: 32.2 % — ABNORMAL LOW (ref 36.0–46.0)
HEMATOCRIT: 32.2 % — AB (ref 36.0–46.0)
Hemoglobin: 10.7 g/dL — ABNORMAL LOW (ref 12.0–15.0)
Hemoglobin: 10.7 g/dL — ABNORMAL LOW (ref 12.0–15.0)
MCH: 30.4 pg (ref 26.0–34.0)
MCH: 31.1 pg (ref 26.0–34.0)
MCHC: 33.2 g/dL (ref 30.0–36.0)
MCHC: 33.2 g/dL (ref 30.0–36.0)
MCV: 91.5 fL (ref 78.0–100.0)
MCV: 93.6 fL (ref 78.0–100.0)
Platelets: 146 10*3/uL — ABNORMAL LOW (ref 150–400)
Platelets: 141 10*3/uL — ABNORMAL LOW (ref 150–400)
RBC: 3.52 MIL/uL — ABNORMAL LOW (ref 3.87–5.11)
RBC: 3.44 MIL/uL — ABNORMAL LOW (ref 3.87–5.11)
RDW: 15.6 % — AB (ref 11.5–15.5)
RDW: 15.8 % — AB (ref 11.5–15.5)
WBC: 11.4 10*3/uL — ABNORMAL HIGH (ref 4.0–10.5)
WBC: 11.9 10*3/uL — AB (ref 4.0–10.5)

## 2016-06-10 LAB — BASIC METABOLIC PANEL
Anion gap: 3 — ABNORMAL LOW (ref 5–15)
BUN: 7 mg/dL (ref 6–20)
CALCIUM: 8 mg/dL — AB (ref 8.9–10.3)
CO2: 24 mmol/L (ref 22–32)
CREATININE: 0.44 mg/dL (ref 0.44–1.00)
Chloride: 110 mmol/L (ref 101–111)
GFR calc Af Amer: >60 mL/min (ref >60)
GFR calc non Af Amer: >60 mL/min (ref >60)
GLUCOSE: 87 mg/dL (ref 65–99)
Potassium: 4.1 mmol/L (ref 3.5–5.1)
Sodium: 137 mmol/L (ref 135–145)

## 2016-06-10 LAB — CREATININE, SERUM
Creatinine, Ser: 0.47 mg/dL (ref 0.44–1.00)
GFR calc non Af Amer: >60 mL/min (ref >60)

## 2016-06-10 LAB — TYPE AND SCREEN
ABO/RH(D): A POS
ANTIBODY SCREEN: NEGATIVE
UNIT DIVISION: 0
Unit division: 0

## 2016-06-10 LAB — MAGNESIUM
Magnesium: 2.2 mg/dL (ref 1.7–2.4)
Magnesium: 1.9 mg/dL (ref 1.7–2.4)

## 2016-06-10 LAB — HEMOGLOBIN A1C
Hgb A1c MFr Bld: 6.1 % — ABNORMAL HIGH (ref 4.8–5.6)
Mean Plasma Glucose: 128 mg/dL

## 2016-06-10 LAB — POCT I-STAT, CHEM 8
BUN: 9 mg/dL (ref 6–20)
CHLORIDE: 99 mmol/L — AB (ref 101–111)
CREATININE: 0.5 mg/dL (ref 0.44–1.00)
Calcium, Ion: 1.27 mmol/L (ref 1.15–1.40)
GLUCOSE: 126 mg/dL — AB (ref 65–99)
HCT: 32 % — ABNORMAL LOW (ref 36.0–46.0)
Hemoglobin: 10.9 g/dL — ABNORMAL LOW (ref 12.0–15.0)
POTASSIUM: 4 mmol/L (ref 3.5–5.1)
Sodium: 134 mmol/L — ABNORMAL LOW (ref 135–145)
TCO2: 23 mmol/L (ref 0–100)

## 2016-06-10 MED ORDER — DEXTROSE 50 % IV SOLN
25.0000 mL | Freq: Once | INTRAVENOUS | Status: AC
  Administered 2016-06-10: 25 mL via INTRAVENOUS

## 2016-06-10 MED ORDER — DM-GUAIFENESIN ER 30-600 MG PO TB12: 1.0000 | ORAL_TABLET | Freq: Two times a day (BID) | ORAL | Status: DC | PRN

## 2016-06-10 MED ORDER — ASPIRIN EC 81 MG PO TBEC
81.0000 mg | DELAYED_RELEASE_TABLET | Freq: Every day | ORAL | Status: DC
  Administered 2016-06-11 – 2016-06-14 (×4): 81 mg via ORAL
  Filled 2016-06-10 (×4): qty 1

## 2016-06-10 MED ORDER — CLOPIDOGREL BISULFATE 75 MG PO TABS
75.0000 mg | ORAL_TABLET | Freq: Every day | ORAL | Status: DC
  Administered 2016-06-11 – 2016-06-14 (×4): 75 mg via ORAL
  Filled 2016-06-10 (×4): qty 1

## 2016-06-10 MED ORDER — DIAZEPAM 5 MG PO TABS
5.0000 mg | ORAL_TABLET | Freq: Three times a day (TID) | ORAL | Status: DC | PRN
  Administered 2016-06-10 – 2016-06-11 (×2): 5 mg via ORAL
  Filled 2016-06-10 (×2): qty 1

## 2016-06-10 MED ORDER — FUROSEMIDE 10 MG/ML IJ SOLN
20.0000 mg | Freq: Two times a day (BID) | INTRAMUSCULAR | Status: AC
  Administered 2016-06-10 – 2016-06-11 (×3): 20 mg via INTRAVENOUS
  Filled 2016-06-10 (×3): qty 2

## 2016-06-10 MED ORDER — IPRATROPIUM-ALBUTEROL 0.5-2.5 (3) MG/3ML IN SOLN
3.0000 mL | Freq: Three times a day (TID) | RESPIRATORY_TRACT | Status: DC
  Administered 2016-06-11 – 2016-06-14 (×10): 3 mL via RESPIRATORY_TRACT
  Filled 2016-06-10 (×10): qty 3

## 2016-06-10 MED ORDER — ASPIRIN EC 325 MG PO TBEC
325.0000 mg | DELAYED_RELEASE_TABLET | Freq: Every day | ORAL | Status: AC
  Administered 2016-06-10: 325 mg via ORAL
  Filled 2016-06-10: qty 1

## 2016-06-10 MED ORDER — IPRATROPIUM-ALBUTEROL 0.5-2.5 (3) MG/3ML IN SOLN
3.0000 mL | Freq: Four times a day (QID) | RESPIRATORY_TRACT | Status: DC
  Administered 2016-06-10 (×2): 3 mL via RESPIRATORY_TRACT
  Filled 2016-06-10 (×3): qty 3

## 2016-06-10 MED ORDER — LACTATED RINGERS IV SOLN: INTRAVENOUS | Status: DC

## 2016-06-10 MED ORDER — GUAIFENESIN ER 600 MG PO TB12
1200.0000 mg | ORAL_TABLET | Freq: Two times a day (BID) | ORAL | Status: DC
  Administered 2016-06-10 – 2016-06-14 (×9): 1200 mg via ORAL
  Filled 2016-06-10 (×10): qty 2

## 2016-06-10 NOTE — Progress Notes (Signed)
[address]       [place] [id number]             [phone]        CARDIOTHORACIC SURGERY PROGRESS NOTE   R1 Day Post-Op Procedure(s) (LRB): CORONARY ARTERY BYPASS GRAFTING x 4 -LIMA to LAD -FREE to DIAGONAL -SVG to OM 1 -SVG to RCA  UTILIZING BILATERAL INTERNAL MAMMARY ARTERY AND ENDOSCOPICALLY HARVESTED RIGHT SAPHENEOUS VEIN (N/A) TRANSESOPHAGEAL ECHOCARDIOGRAM (TEE) (N/A)  Subjective: Looks good.  Mild soreness in chest.   Productive cough  Objective: Vital signs: BP Readings from Last 1 Encounters:  06/10/16 111/74   Pulse Readings from Last 1 Encounters:  06/10/16 88   Resp Readings from Last 1 Encounters:  06/10/16 18   Temp Readings from Last 1 Encounters:  06/10/16 98.6 F (37 C)    Hemodynamics: PAP: (22-62)/(9-44) 34/15 CO:  [2.5 L/min-5.1 L/min] 5.1 L/min CI:  [1.6 L/min/m2-3.2 L/min/m2] 3.2 L/min/m2  Physical Exam:  Rhythm:   sinus  Breath sounds: Scattered rhonchi  Heart sounds:  RRR  Incisions:  Dressings dry, intact  Abdomen:  Soft, non-distended, non-tender  Extremities:  Warm, well-perfused  Chest tubes:  decreasing volume thin serosanguinous output, no air leak    Intake/Output from previous day: 12/22 0701 - 12/23 0700 In: 8544.2 [P.O.:440; I.V.:6497.2; Blood:927; NG/GT:30; IV Piggyback:650] Out: 5685 [Urine:4130; Emesis/NG output:100; Blood:500; Chest Tube:955] Intake/Output this shift: Total I/O In: 71.9 [I.V.:71.9] Out: 550 [Urine:450; Chest Tube:100]  Lab Results:  CBC: Recent Labs  06/09/16 1940 06/10/16 0400  WBC 13.7* 11.4*  HGB 11.8*  11.2* 10.7*  HCT 35.5*  33.0* 32.2*  PLT 157 146*    BMET:  Recent Labs  06/09/16 0454  06/09/16 1940 06/10/16 0400  NA 139  < > 140 137  K 4.5  < > 4.6 4.1  CL 107  < > 107 110  CO2 28  --   --  24  GLUCOSE 95  < > 119* 87  BUN 10  < > 7 7  CREATININE 0.68  < > 0.55  0.40* 0.44  CALCIUM 9.3  --   --  8.0*  < > = values in this interval not  displayed.   PT/INR:   Recent Labs  06/09/16 1340  LABPROT 17.8*  INR 1.45    CBG (last 3)   Recent Labs  06/10/16 0400 06/10/16 0445 06/10/16 0802  GLUCAP 86 132* 103*    ABG    Component Value Date/Time   PHART 7.298 (L) 06/09/2016 2201   PCO2ART 43.3 06/09/2016 2201   PO2ART 74.0 (L) 06/09/2016 2201   HCO3 21.0 06/09/2016 2201   TCO2 22 06/09/2016 2201   ACIDBASEDEF 5.0 (H) 06/09/2016 2201   O2SAT 92.0 06/09/2016 2201    CXR: Bibasilar atelectasis - overall looks good  EKG: NSR w/out acute ischemic changes    Assessment/Plan: S/P Procedure(s) (LRB): CORONARY ARTERY BYPASS GRAFTING x 4 -LIMA to LAD -FREE to DIAGONAL -SVG to OM 1 -SVG to RCA  UTILIZING BILATERAL INTERNAL MAMMARY ARTERY AND ENDOSCOPICALLY HARVESTED RIGHT SAPHENEOUS VEIN (N/A) TRANSESOPHAGEAL ECHOCARDIOGRAM (TEE) (N/A)  Overall doing well POD1 Maintaining NSR w/ stable hemodynamics on very low dose Neo drip Breathing comfortably w/ O2 sats 97% on 2 L/min Expected post op acute blood loss anemia, Hgb 10.7 Expected post op atelectasis, mild Long-standing tobacco abuse w/ likely chronic bronchitis and COPD   Mobilize  Wean Neo as tolerated  Hold diuretics until BP stable off Neo  Pulm toilet  Nebs  D/C lines  Purcell Nailslarence H Owen, MD 06/10/2016 9:05 AM

## 2016-06-10 NOTE — Progress Notes (Signed)
T[facility] BRIEF SICU PROGRESS NOTE  1 Day Post-Op  S/P Procedure(s) (LRB): CORONARY ARTERY BYPASS GRAFTING x 4 -LIMA to LAD -FREE to DIAGONAL -SVG to OM 1 -SVG to RCA  UTILIZING BILATERAL INTERNAL MAMMARY ARTERY AND ENDOSCOPICALLY HARVESTED RIGHT SAPHENEOUS VEIN (N/A) TRANSESOPHAGEAL ECHOCARDIOGRAM (TEE) (N/A)   Stable day.  Anxious NSR w/ stable BP off Neo Breathing comfortably w/ O2 sats 94% on 3 L/min UOP adequate  Plan: Continue routine care.   Low dose lasix.  Resume Valium which is taken daily at home  Purcell Nailslarence H Owen, MD 06/10/2016 5:07 PM

## 2016-06-11 ENCOUNTER — Inpatient Hospital Stay (HOSPITAL_COMMUNITY): Payer: BLUE CROSS/BLUE SHIELD

## 2016-06-11 LAB — BASIC METABOLIC PANEL
Anion gap: 6 (ref 5–15)
BUN: 7 mg/dL (ref 6–20)
CALCIUM: 8.6 mg/dL — AB (ref 8.9–10.3)
CO2: 25 mmol/L (ref 22–32)
CREATININE: 0.45 mg/dL (ref 0.44–1.00)
Chloride: 101 mmol/L (ref 101–111)
GFR calc Af Amer: >60 mL/min (ref >60)
GLUCOSE: 92 mg/dL (ref 65–99)
Potassium: 4 mmol/L (ref 3.5–5.1)
Sodium: 132 mmol/L — ABNORMAL LOW (ref 135–145)

## 2016-06-11 LAB — CBC
HCT: 31.6 % — ABNORMAL LOW (ref 36.0–46.0)
Hemoglobin: 10.5 g/dL — ABNORMAL LOW (ref 12.0–15.0)
MCH: 31.3 pg (ref 26.0–34.0)
MCHC: 33.2 g/dL (ref 30.0–36.0)
MCV: 94 fL (ref 78.0–100.0)
PLATELETS: 140 10*3/uL — AB (ref 150–400)
RBC: 3.36 MIL/uL — ABNORMAL LOW (ref 3.87–5.11)
RDW: 15.9 % — AB (ref 11.5–15.5)
WBC: 12.1 10*3/uL — ABNORMAL HIGH (ref 4.0–10.5)

## 2016-06-11 LAB — GLUCOSE, CAPILLARY
GLUCOSE-CAPILLARY: 88 mg/dL (ref 65–99)
GLUCOSE-CAPILLARY: 106 mg/dL — AB (ref 65–99)
Glucose-Capillary: 97 mg/dL (ref 65–99)

## 2016-06-11 MED ORDER — SODIUM CHLORIDE 0.9% FLUSH
3.0000 mL | Freq: Two times a day (BID) | INTRAVENOUS | Status: DC
  Administered 2016-06-11 – 2016-06-13 (×5): 3 mL via INTRAVENOUS

## 2016-06-11 MED ORDER — SODIUM CHLORIDE 0.9 % IV SOLN: 250.0000 mL | INTRAVENOUS | Status: DC | PRN

## 2016-06-11 MED ORDER — POTASSIUM CHLORIDE CRYS ER 20 MEQ PO TBCR
20.0000 meq | EXTENDED_RELEASE_TABLET | Freq: Every day | ORAL | Status: DC
  Administered 2016-06-11 – 2016-06-14 (×4): 20 meq via ORAL
  Filled 2016-06-11 (×4): qty 1

## 2016-06-11 MED ORDER — MOVING RIGHT ALONG BOOK
Freq: Once | Status: AC
  Administered 2016-06-11: 09:00:00
  Filled 2016-06-11: qty 1

## 2016-06-11 MED ORDER — SODIUM CHLORIDE 0.9% FLUSH: 3.0000 mL | INTRAVENOUS | Status: DC | PRN

## 2016-06-11 MED ORDER — FUROSEMIDE 40 MG PO TABS
40.0000 mg | ORAL_TABLET | Freq: Every day | ORAL | Status: DC
  Administered 2016-06-12 – 2016-06-14 (×3): 40 mg via ORAL
  Filled 2016-06-11 (×3): qty 1

## 2016-06-11 NOTE — Discharge Summary (Signed)
Physician Discharge Summary  Patient ID: Marie Young MRN: [id number] DOB/AGE: 1956/06/08 60 y.o.  Admit date: 06/07/2016 Discharge date: 06/14/2016  Admission Diagnoses:  Patient Active Problem List   Diagnosis Date Noted  . Chest pain 06/08/2016  . Centrilobular emphysema (HCC) 06/08/2016  . Tobacco abuse 06/08/2016  . Anxiety 06/08/2016  . NSTEMI (non-ST elevated myocardial infarction) (HCC) 06/08/2016  . Coronary artery disease involving native coronary artery of native heart with unstable angina pectoris (HCC) 06/08/2016   Discharge Diagnoses:   Patient Active Problem List   Diagnosis Date Noted  . S/P CABG x 4 06/09/2016  . Chest pain 06/08/2016  . Centrilobular emphysema (HCC) 06/08/2016  . Tobacco abuse 06/08/2016  . Anxiety 06/08/2016  . NSTEMI (non-ST elevated myocardial infarction) (HCC) 06/08/2016  . Coronary artery disease involving native coronary artery of native heart with unstable angina pectoris (HCC) 06/08/2016   Discharged Condition: good  Discharge Exam: General appearance: alert, cooperative and no distress Heart: regular rate and rhythm Lungs: clear to auscultation bilaterally Abdomen: benign Extremities: trace edema on RLE Wound: incis healing well   History of Present Illness:  Marie Young is a 60 year old female with no previous history of coronary artery disease but risk factors notable for history of long-standing tobacco abuse and a family history of coronary artery disease. In early October she underwent surgery for repair of a torn rotator cuff in her right shoulder.  She initially did well but within a few weeks she began to experience a dull pain that seemed to radiate from the right shoulder to her back and across her chest. She initially attributed these symptoms to her previous surgery. Symptoms came more severe. The patient has been under stress at home and has a history of anxiety, and her symptoms were subsequently attributed  to anxiety. Yesterday afternoon she developed more severe substernal chest pain associated with shortness of breath. At the time she was driving her automobile and stopped to pull over. Symptoms subsided and she resume driving but the symptoms again recurred. She subsequently presented to the emergency department where initial troponin levels were weakly positive.  CT angiogram of the chest was performed and negative for pulmonary embolus but notable for presence of calcification in the left anterior descending coronary artery. On 2 separate occasions EKGs revealed slight ST segment depression in the anteroseptal leads. She was started on intravenous heparin and admitted to the hospital. She underwent transthoracic echocardiogram and diagnostic cardiac catheterization earlier today. Catheterization reveals severe three-vessel coronary artery disease with normal left ventricular systolic function.  It was felt coronary bypass grafting would be indicated and TCTS consult was obtained.     Hospital Course:  She remained chest pain free during her hospitalization.  She was evaluated by Dr. Cornelius Moraswen who was in agreement she would require coronary bypass grafting procedure.  The risks and benefits of the procedure were explained to the patient and she was agreeable to proceed.  She was taken to the operating room and underwent CABG x 4 utilizing LIMA to LAD, Free IMA to Diagonal, SVG to RCA, and SVG to OM.  She also underwent endoscopic harvest of greater saphenous vein from her right leg.  She tolerated the procedure without difficulty and was taken to the SICU in stable condition.  She was extubated the evening of surgery.  During her stay in the SICU the patient was weaned off Neo synephrine drip as tolerated.  His chest tubes and arterial lines were removed without difficulty.  She  was volume overloaded and responded well to Lasix therapy.  She was placed on Plavix and ASA for acute coronary syndrome.  She was  maintaining NSR and felt medically stable for transfer to the telemetry unit on POD #2.  She has continued to make good progress in her recovery. She has required aggressive pulm toilet/RX and has been weaned off O2.  She has a stable moderate ABL anemia. Incisions are healing well. She is tolerating routine activities. Renal function is WNL. Blood sugars are well controlled. At time of discharge she is felt to be quite stable.      Significant Diagnostic Studies: angiography:    LV end diastolic pressure is normal.  The left ventricular systolic function is normal. The left ventricular ejection fraction is 55-65% by visual estimate.  SEVERE MULTIVESSEL CAD:  Prox RCA lesion, 85 %stenosed. Tandem Mid RCA lesion, 90 %stenosed.  Ost LAD lesion, 40 %stenosed. Dist LAD lesion, 40 %stenosed.  Tandem Mid LAD-1 lesion, 65 %stenosed & Mid LAD-2 lesion, 60 %stenosed.  1st Diag lesion, 95 %stenosed.  2nd Mrg lesion, 99 %stenosed. TIMI 2 Flow distally would suggest that this is the Culprit Lesion.   The patient clearly has multivessel disease with severe stenoses in the proximal and mid RCA, proximal OM 2 and D1 as well as tandem moderate to severe lesions in the LAD. Difficult situation as all of these are potentially PCI targets, she deserves to have CT surgical consultation to discuss options as this is also clearly surgical disease..  Treatments: surgery:    Coronary Artery Bypass Grafting x 4              Left Internal Mammary Artery to Distal Left Anterior Descending Coronary Artery             Free Right Internal Mammary Artery to Diagonal Branch Coronary Artery             Saphenous Vein Graft to Distal Right Coronary Artery             Saphenous Vein Graft to First Obtuse Marginal Branch of Left Circumflex Coronary Artery             Endoscopic Vein Harvest from Right Thigh and Lower Leg  Disposition: 01-Home or Self Care   Discharge Medications:  The patient has been discharged  on:   1.Beta Blocker:  Yes [ x  ]                              No   [   ]                              If No, reason:  2.Ace Inhibitor/ARB: Yes [  y ]                                     No  [    ]                                     If No, reason:  3.Statin:   Yes [ x  ]                  No  [   ]  If No, reason:  4.Ecasa:  Yes  [ x  ]                  No   [   ]                  If No, reason:      Allergies as of 06/14/2016      Reactions   Macrobid [nitrofurantoin Macrocrystal] Nausea Only   "ran a temperature with this medication."   Sulfa Antibiotics Nausea Only, Other (See Comments)   " ran a temperature with it."   Hydrocodone Nausea Only      Medication List    STOP taking these medications   BC HEADACHE POWDER PO   benzonatate 100 MG capsule Commonly known as:  TESSALON   diazepam 5 MG tablet Commonly known as:  VALIUM   naproxen 500 MG tablet Commonly known as:  NAPROSYN   traMADol 50 MG tablet Commonly known as:  ULTRAM     TAKE these medications   aspirin 81 MG EC tablet Take 1 tablet (81 mg total) by mouth daily.   atorvastatin 80 MG tablet Commonly known as:  LIPITOR Take 1 tablet (80 mg total) by mouth daily at 6 PM.   clopidogrel 75 MG tablet Commonly known as:  PLAVIX Take 1 tablet (75 mg total) by mouth daily.   COMBIVENT RESPIMAT 20-100 MCG/ACT Aers respimat Generic drug:  Ipratropium-Albuterol Inhale 2 puffs into the lungs 2 (two) times daily.   lisinopril 10 MG tablet Commonly known as:  PRINIVIL,ZESTRIL Take 1 tablet (10 mg total) by mouth daily.   metoprolol tartrate 25 MG tablet Commonly known as:  LOPRESSOR Take 0.5 tablets (12.5 mg total) by mouth 2 (two) times daily.   oxyCODONE 5 MG immediate release tablet Commonly known as:  Oxy IR/ROXICODONE Take 1-2 tablets (5-10 mg total) by mouth every 6 (six) hours as needed for severe pain.      Follow-up Information    Ellsworth Lennox, PA Follow  up.   Specialties:  Physician Assistant, Cardiology Why:  Cardiology Hospital Follow-Up on 06/27/2016 at 10:00AM.  Contact information: [address] [place] [address] [place] [address] [phone]        Purcell Nails, MD Follow up.   Specialty:  Cardiothoracic Surgery Contact information: [address] [place] [address] [phone]           Signed: Rowe Clack 06/14/2016, 7:58 AM

## 2016-06-11 NOTE — Progress Notes (Addendum)
[address]       [place] [id number]             [phone]        CARDIOTHORACIC SURGERY PROGRESS NOTE   R2 Days Post-Op Procedure(s) (LRB): CORONARY ARTERY BYPASS GRAFTING x 4 -LIMA to LAD -FREE to DIAGONAL -SVG to OM 1 -SVG to RCA  UTILIZING BILATERAL INTERNAL MAMMARY ARTERY AND ENDOSCOPICALLY HARVESTED RIGHT SAPHENEOUS VEIN (N/A) TRANSESOPHAGEAL ECHOCARDIOGRAM (TEE) (N/A)  Subjective: Looks good and feels better.  Productive cough.  Sore in chest.  Objective: Vital signs: BP Readings from Last 1 Encounters:  06/11/16 112/74   Pulse Readings from Last 1 Encounters:  06/11/16 81   Resp Readings from Last 1 Encounters:  06/11/16 16   Temp Readings from Last 1 Encounters:  06/11/16 97.7 F (36.5 C) (Oral)    Hemodynamics: PAP: (30-31)/(12-13) 31/12  Physical Exam:  Rhythm:   sinus  Breath sounds: Scattered rhonchi  Heart sounds:  RRR  Incisions:  Dressings dry intact  Abdomen:  Soft, non-distended, non-tender  Extremities:  Warm, well-perfused  Chest tubes:  decreasing volume thin serosanguinous output, no air leak    Intake/Output from previous day: 12/23 0701 - 12/24 0700 In: 1385 [P.O.:950; I.V.:435] Out: 2940 [Urine:2180; Chest Tube:760] Intake/Output this shift: Total I/O In: 200 [P.O.:200] Out: -   Lab Results:  CBC: Recent Labs  06/10/16 1757 06/11/16 0430  WBC 11.9* 12.1*  HGB 10.7* 10.5*  HCT 32.2* 31.6*  PLT 141* 140*    BMET:  Recent Labs  06/10/16 0400 06/10/16 1751 06/10/16 1757 06/11/16 0430  NA 137 134*  --  132*  K 4.1 4.0  --  4.0  CL 110 99*  --  101  CO2 24  --   --  25  GLUCOSE 87 126*  --  92  BUN 7 9  --  7  CREATININE 0.44 0.50 0.47 0.45  CALCIUM 8.0*  --   --  8.6*     PT/INR:   Recent Labs  06/09/16 1340  LABPROT 17.8*  INR 1.45    CBG (last 3)   Recent Labs  06/10/16 2337 06/11/16 0335 06/11/16 0824  GLUCAP 126* 88 97    ABG    Component Value Date/Time   PHART  7.298 (L) 06/09/2016 2201   PCO2ART 43.3 06/09/2016 2201   PO2ART 74.0 (L) 06/09/2016 2201   HCO3 21.0 06/09/2016 2201   TCO2 23 06/10/2016 1751   ACIDBASEDEF 5.0 (H) 06/09/2016 2201   O2SAT 92.0 06/09/2016 2201    CXR: PORTABLE CHEST 1 VIEW  COMPARISON:  06/10/2016  FINDINGS: Swan-Ganz catheter has been removed. Mediastinal drains, bilateral chest tubes, and right IJ vascular sheath are stable in position. Cardiomegaly evident with bibasilar atelectasis/partial collapse, worse in the left lower lobe. Small pleural effusions noted. Very small left apical pneumothorax present. Trachea is midline.  IMPRESSION: Persistent bibasilar atelectasis/ partial collapse, worse in the left lower lobe  Trace pleural effusions  Tiny left apical pneumothorax   Electronically Signed   By: Judie PetitM.  Shick M.D.   On: 06/11/2016 08:49  Assessment/Plan: S/P Procedure(s) (LRB): CORONARY ARTERY BYPASS GRAFTING x 4 -LIMA to LAD -FREE to DIAGONAL -SVG to OM 1 -SVG to RCA  UTILIZING BILATERAL INTERNAL MAMMARY ARTERY AND ENDOSCOPICALLY HARVESTED RIGHT SAPHENEOUS VEIN (N/A) TRANSESOPHAGEAL ECHOCARDIOGRAM (TEE) (N/A)  Doing well POD2 Maintaining NSR w/ stable BP Breathing comfortably w/ O2 sats 98-99% on 3 L/min Expected post op acute blood  loss anemia, mild, stable Expected post op atelectasis, mild Expected post op volume excess, mild, diuresing some Long-standing tobacco abuse w/ likely chronic bronchitis and COPD   Mobilize  Diuresis  Pulm toilet and nebs  D/C tubes and Foley  DAPT for ACS  Statin  Beta blocker  Transfer step down   Purcell Nailslarence H Baley Shands, MD 06/11/2016 9:03 AM

## 2016-06-12 ENCOUNTER — Inpatient Hospital Stay (HOSPITAL_COMMUNITY): Payer: BLUE CROSS/BLUE SHIELD

## 2016-06-12 DIAGNOSIS — Z951 Presence of aortocoronary bypass graft: Secondary | ICD-10-CM

## 2016-06-12 LAB — CBC
HEMATOCRIT: 31 % — AB (ref 36.0–46.0)
HEMOGLOBIN: 10.3 g/dL — AB (ref 12.0–15.0)
MCH: 30.6 pg (ref 26.0–34.0)
MCHC: 33.2 g/dL (ref 30.0–36.0)
MCV: 92 fL (ref 78.0–100.0)
Platelets: 149 10*3/uL — ABNORMAL LOW (ref 150–400)
RBC: 3.37 MIL/uL — ABNORMAL LOW (ref 3.87–5.11)
RDW: 14.8 % (ref 11.5–15.5)
WBC: 12.2 10*3/uL — AB (ref 4.0–10.5)

## 2016-06-12 LAB — BASIC METABOLIC PANEL
ANION GAP: 6 (ref 5–15)
BUN: 6 mg/dL (ref 6–20)
CALCIUM: 8.5 mg/dL — AB (ref 8.9–10.3)
CHLORIDE: 100 mmol/L — AB (ref 101–111)
CO2: 31 mmol/L (ref 22–32)
Creatinine, Ser: 0.62 mg/dL (ref 0.44–1.00)
GFR calc non Af Amer: >60 mL/min (ref >60)
Glucose, Bld: 102 mg/dL — ABNORMAL HIGH (ref 65–99)
Potassium: 4.3 mmol/L (ref 3.5–5.1)
SODIUM: 137 mmol/L (ref 135–145)

## 2016-06-12 NOTE — Progress Notes (Signed)
Patient Name: Marie Young Date of Encounter: 06/12/2016  [facility]  Hospital Problem List     Principal Problem:   S/P CABG x 4 Active Problems:   Chest pain   Centrilobular emphysema (HCC)   Tobacco abuse   Anxiety   NSTEMI (non-ST elevated myocardial infarction) (HCC)   Coronary artery disease involving native coronary artery of native heart with unstable angina pectoris (HCC)     Subjective   Chest sore; no dyspnea  Inpatient Medications    Scheduled Meds: . acetaminophen  1,000 mg Oral Q6H  . aspirin EC  81 mg Oral Daily  . atorvastatin  80 mg Oral q1800  . bisacodyl  10 mg Oral Daily   Or  . bisacodyl  10 mg Rectal Daily  . clopidogrel  75 mg Oral Daily  . docusate sodium  200 mg Oral Daily  . furosemide  40 mg Oral Daily  . guaiFENesin  1,200 mg Oral BID  . ipratropium-albuterol  3 mL Nebulization TID  . metoprolol tartrate  12.5 mg Oral BID  . pantoprazole  40 mg Oral Daily  . potassium chloride (KCL MULTIRUN) 30 mEq in 265 mL IVPB  30 mEq Intravenous Once  . potassium chloride  20 mEq Oral Daily  . sodium chloride flush  3 mL Intravenous Q12H  . sodium chloride flush  3 mL Intravenous Q12H   Continuous Infusions: . sodium chloride     PRN Meds: sodium chloride, diazepam, metoprolol, morphine injection, ondansetron (ZOFRAN) IV, oxyCODONE, sodium chloride flush, sodium chloride flush, traMADol   Vital Signs    Vitals:   06/11/16 1320 06/11/16 2006 06/11/16 2031 06/12/16 0540  BP: 131/69 114/61  129/69  Pulse: 83 91  94  Resp: 20 18  18   Temp: 97.7 F (36.5 C) 98.2 F (36.8 C)  97.7 F (36.5 C)  TempSrc: Oral Oral  Oral  SpO2: 97% 94% 96% 90%  Weight:    129 lb 12.8 oz (58.9 kg)  Height:        Intake/Output Summary (Last 24 hours) at 06/12/16 0644 Last data filed at 06/11/16 1630  Gross per 24 hour  Intake              580 ml  Output             1950 ml  Net            -1370 ml   Filed Weights   06/10/16 0430 06/11/16 0600 06/12/16 0540  Weight: 141 lb 5 oz (64.1 kg) 138 lb 0.1 oz (62.6 kg) 129 lb 12.8 oz (58.9 kg)    Physical Exam    GEN: Well nourished, well developed, in no acute distress.  HEENT: Grossly normal.  Neck: Supple Cardiac: RRR; ext no edema Respiratory:  Diminished BS bases; s/p sternotomy GI: Soft, nontender, nondistended. MS: no deformity or atrophy. Skin: warm and dry, no rash. Neuro:  Strength and sensation are intact. Psych: AAOx3.  Normal affect.  Labs    CBC  Recent Labs  06/11/16 0430 06/12/16 0225  WBC 12.1* 12.2*  HGB 10.5* 10.3*  HCT 31.6* 31.0*  MCV 94.0 92.0  PLT 140* 149*   Basic Metabolic Panel  Recent Labs  06/10/16 0400  06/10/16 1757 06/11/16 0430 06/12/16 0225  NA 137  < >  --  132* 137  K 4.1  < >  --  4.0 4.3  CL 110  < >  --  101 100*  CO2 24  --   --  25 31  GLUCOSE 87  < >  --  92 102*  BUN 7  < >  --  7 6  CREATININE 0.44  < > 0.47 0.45 0.62  CALCIUM 8.0*  --   --  8.6* 8.5*  MG 2.2  --  1.9  --   --   < > = values in this interval not displayed.   Telemetry    Sinus- Personally Reviewed   Radiology    Dg Chest Port 1 View  Result Date: 06/11/2016 CLINICAL DATA:  Coronary bypass EXAM: PORTABLE CHEST 1 VIEW COMPARISON:  06/10/2016 FINDINGS: Swan-Ganz catheter has been removed. Mediastinal drains, bilateral chest tubes, and right IJ vascular sheath are stable in position. Cardiomegaly evident with bibasilar atelectasis/partial collapse, worse in the left lower lobe. Small pleural effusions noted. Very small left apical pneumothorax present. Trachea is midline. IMPRESSION: Persistent bibasilar atelectasis/ partial collapse, worse in the left lower lobe Trace pleural effusions Tiny left apical pneumothorax Electronically Signed   By: Judie PetitM.  Shick M.D.   On: 06/11/2016 08:49    Patient Profile     60 year old female admitted with non-ST elevation myocardial infarction. Cardiac catheterization revealed severe  three-vessel coronary disease. She is now status post coronary artery bypass graft.  Assessment & Plan    1 coronary artery disease/non-ST elevation myocardial infarction-patient is status post coronary artery bypass and graft. She is doing well. Continue aspirin, Plavix, statin and metoprolol. Patient remains in sinus rhythm.  2 postoperative volume excess-continue diuresis.  3 Hyperlipidemia-continue statin. We will arrange lipids and liver 4 weeks following discharge.  4 COPD  Signed, Olga MillersBrian Kimo Bancroft, MD  06/12/2016, 6:44 AM

## 2016-06-12 NOTE — Progress Notes (Signed)
      [address]       [place] [id number]             [phone]     CARDIOTHORACIC SURGERY PROGRESS NOTE  3 Days Post-Op  S/P Procedure(s) (LRB): CORONARY ARTERY BYPASS GRAFTING x 4 -LIMA to LAD -FREE to DIAGONAL -SVG to OM 1 -SVG to RCA  UTILIZING BILATERAL INTERNAL MAMMARY ARTERY AND ENDOSCOPICALLY HARVESTED RIGHT SAPHENEOUS VEIN (N/A) TRANSESOPHAGEAL ECHOCARDIOGRAM (TEE) (N/A)  Subjective: Feels okay but sore.  Not interested in getting up to go for a walk.  Eating some but marginal appetite.  No SOB  Objective: Vital signs in last 24 hours: Temp:  [97.7 F (36.5 C)-98.6 F (37 C)] 97.7 F (36.5 C) (12/25 0540) Pulse Rate:  [83-94] 94 (12/25 0540) Cardiac Rhythm: Normal sinus rhythm (12/24 2000) Resp:  [16-23] 18 (12/25 0540) BP: (114-131)/(61-79) 129/69 (12/25 0540) SpO2:  [90 %-97 %] 90 % (12/25 0540) Weight:  [129 lb 12.8 oz (58.9 kg)] 129 lb 12.8 oz (58.9 kg) (12/25 0540)  Physical Exam:  Rhythm:   sinus  Breath sounds: Scattered rhonchi  Heart sounds:  RRR  Incisions:  Dressing dry, intact  Abdomen:  Soft, non-distended, non-tender  Extremities:  Warm, well-perfused    Intake/Output from previous day: 12/24 0701 - 12/25 0700 In: 560 [P.O.:560] Out: 1950 [Urine:1850; Chest Tube:100] Intake/Output this shift: No intake/output data recorded.  Lab Results:  Recent Labs  06/11/16 0430 06/12/16 0225  WBC 12.1* 12.2*  HGB 10.5* 10.3*  HCT 31.6* 31.0*  PLT 140* 149*   BMET:  Recent Labs  06/11/16 0430 06/12/16 0225  NA 132* 137  K 4.0 4.3  CL 101 100*  CO2 25 31  GLUCOSE 92 102*  BUN 7 6  CREATININE 0.45 0.62  CALCIUM 8.6* 8.5*    CBG (last 3)   Recent Labs  06/11/16 0335 06/11/16 0824 06/11/16 1146  GLUCAP 88 97 106*   PT/INR:   Recent Labs  06/09/16 1340  LABPROT 17.8*  INR 1.45    CXR:  CHEST  2 VIEW  COMPARISON:  One day prior  FINDINGS: Prior median sternotomy. Mild hyperinflation. Midline trachea.  Mild cardiomegaly. Small bilateral pleural effusions remain. Removal of Cordis sheath, mediastinal drain, and bilateral chest tubes. Left-sided pneumothorax has resolved. No congestive failure. Improved left base aeration with persistent bibasilar atelectasis.  IMPRESSION: Removal of support apparatus, without pneumothorax.  Persistent small bilateral pleural effusions and bibasilar atelectasis. Overall improved aeration.   Electronically Signed   By: Jeronimo GreavesKyle  Talbot M.D.   On: 06/12/2016 07:49   Assessment/Plan: S/P Procedure(s) (LRB): CORONARY ARTERY BYPASS GRAFTING x 4 -LIMA to LAD -FREE to DIAGONAL -SVG to OM 1 -SVG to RCA  UTILIZING BILATERAL INTERNAL MAMMARY ARTERY AND ENDOSCOPICALLY HARVESTED RIGHT SAPHENEOUS VEIN (N/A) TRANSESOPHAGEAL ECHOCARDIOGRAM (TEE) (N/A)  Overall doing well POD3  Mobilize   Diuresis  Pulm toilet  DAPT  Metoprolol  Statin  No smoking  Marie Nailslarence H Cori Henningsen, MD 06/12/2016 9:08 AM

## 2016-06-13 ENCOUNTER — Encounter (HOSPITAL_COMMUNITY): Payer: Self-pay | Admitting: Thoracic Surgery (Cardiothoracic Vascular Surgery)

## 2016-06-13 MED ORDER — LISINOPRIL 10 MG PO TABS
10.0000 mg | ORAL_TABLET | Freq: Every day | ORAL | Status: DC
  Administered 2016-06-13 – 2016-06-14 (×2): 10 mg via ORAL
  Filled 2016-06-13 (×2): qty 1

## 2016-06-13 NOTE — Progress Notes (Signed)
CARDIAC REHAB PHASE I   PRE:  Rate/Rhythm: 89 SR  BP:  Sitting: 128/79        SaO2: 92 RA  MODE:  Ambulation: 300 ft   POST:  Rate/Rhythm: 113 ST  BP:  Sitting: 128/81         SaO2: 93 RA  Pt ambulated 300 ft on RA, hand held assist, slow, mostly steady gait (occassional mild loss of balance), tolerated well. Pt c/o mild DOE, chest soreness, mild dizziness, standing rest x2. States she feels a little "wobbly". Encouraged IS, additional ambulation x2 today. Pt to recliner after walk, feet elevated, call bell within reach. Will follow up tomorrow for education prior to discharge.   [phone] Joylene GrapesEmily C Haillee Johann, RN, BSN 06/13/2016 9:46 AM

## 2016-06-13 NOTE — Progress Notes (Signed)
Pacing wires removed per order. Wire ends intact upon removal. Pt tolerated well. Vitals stable. Sites are clean and dry. Gauze dressings placed over sites. Pt on bed rest for 1 hour. BP is being monitored every 15 minutes. Will continue to monitor.   Berdine DanceLauren Moffitt BSN, RN

## 2016-06-13 NOTE — Progress Notes (Addendum)
[address]       [place] [id number]             [phone]      4 Days Post-Op Procedure(s) (LRB): CORONARY ARTERY BYPASS GRAFTING x 4 -LIMA to LAD -FREE to DIAGONAL -SVG to OM 1 -SVG to RCA  UTILIZING BILATERAL INTERNAL MAMMARY ARTERY AND ENDOSCOPICALLY HARVESTED RIGHT SAPHENEOUS VEIN (N/A) TRANSESOPHAGEAL ECHOCARDIOGRAM (TEE) (N/A) Subjective: Feels fairly well, some sputum production, chest sore with cough, poor appetite, no BM yet  Objective: Vital signs in last 24 hours: Temp:  [97.7 F (36.5 C)-98.1 F (36.7 C)] 98.1 F (36.7 C) (12/26 0541) Pulse Rate:  [88-92] 91 (12/26 0541) Cardiac Rhythm: Normal sinus rhythm (12/25 1930) Resp:  [18] 18 (12/26 0541) BP: (120-141)/(67-82) 141/82 (12/26 0541) SpO2:  [91 %-98 %] 91 % (12/26 0541) Weight:  [126 lb 6.4 oz (57.3 kg)] 126 lb 6.4 oz (57.3 kg) (12/26 0541)  Hemodynamic parameters for last 24 hours:    Intake/Output from previous day: 12/25 0701 - 12/26 0700 In: 240 [P.O.:240] Out: -  Intake/Output this shift: No intake/output data recorded.  General appearance: alert, cooperative and no distress Heart: regular rate and rhythm Lungs: some upper airway coarseness, improves with cough Abdomen: benign Extremities: no edema Wound: incis healing well  Lab Results:  Recent Labs  06/11/16 0430 06/12/16 0225  WBC 12.1* 12.2*  HGB 10.5* 10.3*  HCT 31.6* 31.0*  PLT 140* 149*   BMET:  Recent Labs  06/11/16 0430 06/12/16 0225  NA 132* 137  K 4.0 4.3  CL 101 100*  CO2 25 31  GLUCOSE 92 102*  BUN 7 6  CREATININE 0.45 0.62  CALCIUM 8.6* 8.5*    PT/INR: No results for input(s): LABPROT, INR in the last 72 hours. ABG    Component Value Date/Time   PHART 7.298 (L) 06/09/2016 2201   HCO3 21.0 06/09/2016 2201   TCO2 23 06/10/2016 1751   ACIDBASEDEF 5.0 (H) 06/09/2016 2201   O2SAT 92.0 06/09/2016 2201   CBG (last 3)   Recent Labs  06/11/16 0335 06/11/16 0824 06/11/16 1146    GLUCAP 88 97 106*    Meds Scheduled Meds: . acetaminophen  1,000 mg Oral Q6H  . aspirin EC  81 mg Oral Daily  . atorvastatin  80 mg Oral q1800  . bisacodyl  10 mg Oral Daily   Or  . bisacodyl  10 mg Rectal Daily  . clopidogrel  75 mg Oral Daily  . docusate sodium  200 mg Oral Daily  . furosemide  40 mg Oral Daily  . guaiFENesin  1,200 mg Oral BID  . ipratropium-albuterol  3 mL Nebulization TID  . metoprolol tartrate  12.5 mg Oral BID  . pantoprazole  40 mg Oral Daily  . potassium chloride  20 mEq Oral Daily  . sodium chloride flush  3 mL Intravenous Q12H  . sodium chloride flush  3 mL Intravenous Q12H   Continuous Infusions: . sodium chloride     PRN Meds:.sodium chloride, diazepam, metoprolol, morphine injection, ondansetron (ZOFRAN) IV, oxyCODONE, sodium chloride flush, sodium chloride flush, traMADol  Xrays Dg Chest 2 View  Result Date: 06/12/2016 CLINICAL DATA:  Five day postop for CABG. Weakness and chest discomfort. COPD. EXAM: CHEST  2 VIEW COMPARISON:  One day prior FINDINGS: Prior median sternotomy. Mild hyperinflation. Midline trachea. Mild cardiomegaly. Small bilateral pleural effusions remain. Removal of Cordis sheath, mediastinal drain, and bilateral chest tubes. Left-sided pneumothorax has  resolved. No congestive failure. Improved left base aeration with persistent bibasilar atelectasis. IMPRESSION: Removal of support apparatus, without pneumothorax. Persistent small bilateral pleural effusions and bibasilar atelectasis. Overall improved aeration. Electronically Signed   By: Jeronimo GreavesKyle  Talbot M.D.   On: 06/12/2016 07:49    Assessment/Plan: S/P Procedure(s) (LRB): CORONARY ARTERY BYPASS GRAFTING x 4 -LIMA to LAD -FREE to DIAGONAL -SVG to OM 1 -SVG to RCA  UTILIZING BILATERAL INTERNAL MAMMARY ARTERY AND ENDOSCOPICALLY HARVESTED RIGHT SAPHENEOUS VEIN (N/A) TRANSESOPHAGEAL ECHOCARDIOGRAM (TEE) (N/A)  1 doing well overall 2 weaning O2 off, cont pulm toilet, nebs,  discussed imprtance of smoking cessation and she is motivated  3 add low dose ACE-I for HTN, cont beta blocker, hemodyn stable in sinus rhythm 4 cont gentle diuresis 5 sugars adeq controlled, HGBA1c 6.1- discussed diet measures. Sugars are pretty well controlled currently.  6 ASA /Plavix for DAPT 7 no new labs today  LOS: 5 days    GOLD,WAYNE E 06/13/2016   I have seen and examined the patient and agree with the assessment and plan as outlined.  Making reasonably good progress.  Tentatively plan d/c home tomorrow  Purcell Nailslarence H Jourdin Gens, MD 06/13/2016 9:16 AM

## 2016-06-14 MED ORDER — IPRATROPIUM-ALBUTEROL 0.5-2.5 (3) MG/3ML IN SOLN: 3.0000 mL | RESPIRATORY_TRACT | Status: DC | PRN

## 2016-06-14 MED ORDER — LISINOPRIL 10 MG PO TABS: 10.0000 mg | ORAL_TABLET | Freq: Every day | ORAL | 1 refills | Status: DC

## 2016-06-14 MED ORDER — ASPIRIN 81 MG PO TBEC: 81.0000 mg | DELAYED_RELEASE_TABLET | Freq: Every day | ORAL | Status: AC

## 2016-06-14 MED ORDER — ATORVASTATIN CALCIUM 80 MG PO TABS: 80.0000 mg | ORAL_TABLET | Freq: Every day | ORAL | 1 refills | Status: DC

## 2016-06-14 MED ORDER — CLOPIDOGREL BISULFATE 75 MG PO TABS: 75.0000 mg | ORAL_TABLET | Freq: Every day | ORAL | 1 refills | Status: DC

## 2016-06-14 MED ORDER — METOPROLOL TARTRATE 25 MG PO TABS: 12.5000 mg | ORAL_TABLET | Freq: Two times a day (BID) | ORAL | 1 refills | Status: DC

## 2016-06-14 MED ORDER — OXYCODONE HCL 5 MG PO TABS: 5.0000 mg | ORAL_TABLET | Freq: Four times a day (QID) | ORAL | 0 refills | Status: DC | PRN

## 2016-06-14 MED FILL — Heparin Sodium (Porcine) Inj 1000 Unit/ML: INTRAMUSCULAR | Qty: 30 | Status: AC

## 2016-06-14 MED FILL — Magnesium Sulfate Inj 50%: INTRAMUSCULAR | Qty: 10 | Status: AC

## 2016-06-14 MED FILL — Potassium Chloride Inj 2 mEq/ML: INTRAVENOUS | Qty: 40 | Status: AC

## 2016-06-14 NOTE — Progress Notes (Signed)
Pt has been discharged home with daughter. IV and telemetry box removed. Pt and pt's daughter received discharge instructions and all questions were answered. Pt left with all of her belongings. Pt left the unit via wheelchair and was accompanied by this RN and pt's daughter.   Berdine DanceLauren Moffitt BSN, RN

## 2016-06-14 NOTE — Progress Notes (Addendum)
[address]       [place] [id number]             [phone]      5 Days Post-Op Procedure(s) (LRB): CORONARY ARTERY BYPASS GRAFTING x 4 -LIMA to LAD -FREE to DIAGONAL -SVG to OM 1 -SVG to RCA  UTILIZING BILATERAL INTERNAL MAMMARY ARTERY AND ENDOSCOPICALLY HARVESTED RIGHT SAPHENEOUS VEIN (N/A) TRANSESOPHAGEAL ECHOCARDIOGRAM (TEE) (N/A) Subjective: Looks and feels well, stronger, off O2  Objective: Vital signs in last 24 hours: Temp:  [97.5 F (36.4 C)-98.8 F (37.1 C)] 97.5 F (36.4 C) (12/27 0634) Pulse Rate:  [84-103] 87 (12/27 0634) Cardiac Rhythm: Normal sinus rhythm (12/26 1900) Resp:  [17-20] 18 (12/27 0634) BP: (114-134)/(71-78) 133/76 (12/27 0634) SpO2:  [92 %-98 %] 97 % (12/27 0634) Weight:  [121 lb 14.4 oz (55.3 kg)] 121 lb 14.4 oz (55.3 kg) (12/27 [id number])  Hemodynamic parameters for last 24 hours:    Intake/Output from previous day: No intake/output data recorded. Intake/Output this shift: No intake/output data recorded.  General appearance: alert, cooperative and no distress Heart: regular rate and rhythm Lungs: clear to auscultation bilaterally Abdomen: benign Extremities: trace edema on RLE Wound: incis healing well  Lab Results:  Recent Labs  06/12/16 0225  WBC 12.2*  HGB 10.3*  HCT 31.0*  PLT 149*   BMET:  Recent Labs  06/12/16 0225  NA 137  K 4.3  CL 100*  CO2 31  GLUCOSE 102*  BUN 6  CREATININE 0.62  CALCIUM 8.5*    PT/INR: No results for input(s): LABPROT, INR in the last 72 hours. ABG    Component Value Date/Time   PHART 7.298 (L) 06/09/2016 2201   HCO3 21.0 06/09/2016 2201   TCO2 23 06/10/2016 1751   ACIDBASEDEF 5.0 (H) 06/09/2016 2201   O2SAT 92.0 06/09/2016 2201   CBG (last 3)   Recent Labs  06/11/16 0824 06/11/16 1146  GLUCAP 97 106*    Meds Scheduled Meds: . acetaminophen  1,000 mg Oral Q6H  . aspirin EC  81 mg Oral Daily  . atorvastatin  80 mg Oral q1800  . bisacodyl  10 mg Oral  Daily   Or  . bisacodyl  10 mg Rectal Daily  . clopidogrel  75 mg Oral Daily  . docusate sodium  200 mg Oral Daily  . furosemide  40 mg Oral Daily  . guaiFENesin  1,200 mg Oral BID  . ipratropium-albuterol  3 mL Nebulization TID  . lisinopril  10 mg Oral Daily  . metoprolol tartrate  12.5 mg Oral BID  . pantoprazole  40 mg Oral Daily  . potassium chloride  20 mEq Oral Daily  . sodium chloride flush  3 mL Intravenous Q12H  . sodium chloride flush  3 mL Intravenous Q12H   Continuous Infusions: . sodium chloride     PRN Meds:.sodium chloride, diazepam, metoprolol, morphine injection, ondansetron (ZOFRAN) IV, oxyCODONE, sodium chloride flush, sodium chloride flush, traMADol  Xrays No results found.  Assessment/Plan: S/P Procedure(s) (LRB): CORONARY ARTERY BYPASS GRAFTING x 4 -LIMA to LAD -FREE to DIAGONAL -SVG to OM 1 -SVG to RCA  UTILIZING BILATERAL INTERNAL MAMMARY ARTERY AND ENDOSCOPICALLY HARVESTED RIGHT SAPHENEOUS VEIN (N/A) TRANSESOPHAGEAL ECHOCARDIOGRAM (TEE) (N/A) Plan for discharge: see discharge orders She is doing very well  LOS: 6 days    GOLD,WAYNE E 06/14/2016   I have seen and examined the patient and agree with the assessment and plan as outlined.  D/C home.  Instructions given.  Purcell Nailslarence H Owen, MD 06/14/2016 8:16 AM

## 2016-06-14 NOTE — Care Management Note (Signed)
Case Management Note Donn PieriniKristi Clemence Lengyel RN, BSN Unit 2W-Case Manager [phone]  Patient Details  Name: Marie AzureSandra K Puff MRN: [id number] Date of Birth: [birth year]  Subjective/Objective:  Pt admitted s/p CABGx4                  Action/Plan: PTA pt lived at home- plan to return home with family- no CM needs noted.   Expected Discharge Date:    06/14/16              Expected Discharge Plan:  Home/Self Care  In-House Referral:     Discharge planning Services  CM Consult  Post Acute Care Choice:    Choice offered to:     DME Arranged:    DME Agency:     HH Arranged:    HH Agency:     Status of Service:  Completed, signed off  If discussed at [employer] Length of Stay Meetings, dates discussed:    Additional Comments:  Darrold SpanWebster, Roberto Romanoski Hall, RN 06/14/2016, 9:52 AM

## 2016-06-14 NOTE — Progress Notes (Signed)
Chest tube sutures were removed per order. Steri strips applied with benzoin. No drainage noted from sites.   Berdine DanceLauren Moffitt BSN, RN

## 2016-06-14 NOTE — Progress Notes (Signed)
CARDIAC REHAB PHASE I   Pt declines ambulation at this time, states she is awaiting discharge. Cardiac surgery discharge education completed with pt and daughter at bedside. Reviewed risk factors, tobacco cessation, IS, sternal precautions, activity progression, exercise, heart healthy diet, carb counting, daily weights and phase 2 cardiac rehab. Pt verbalized understanding, receptive to education. Pt agrees to phase 2 cardiac rehab referral, [facility]. Pt in recliner, call bell within reach.   [phone] Joylene GrapesEmily C Delmy Holdren, RN, BSN 06/14/2016 12:14 PM

## 2016-06-27 ENCOUNTER — Encounter: Payer: Self-pay | Admitting: Student

## 2016-06-27 ENCOUNTER — Ambulatory Visit (INDEPENDENT_AMBULATORY_CARE_PROVIDER_SITE_OTHER): Payer: BLUE CROSS/BLUE SHIELD | Admitting: Student

## 2016-06-27 VITALS — BP 108/70 | HR 84 | Ht 63.0 in | Wt 116.5 lb

## 2016-06-27 DIAGNOSIS — Z87891 Personal history of nicotine dependence: Secondary | ICD-10-CM

## 2016-06-27 DIAGNOSIS — E785 Hyperlipidemia, unspecified: Secondary | ICD-10-CM

## 2016-06-27 DIAGNOSIS — Z951 Presence of aortocoronary bypass graft: Secondary | ICD-10-CM

## 2016-06-27 DIAGNOSIS — I2510 Atherosclerotic heart disease of native coronary artery without angina pectoris: Secondary | ICD-10-CM

## 2016-06-27 DIAGNOSIS — Z79899 Other long term (current) drug therapy: Secondary | ICD-10-CM | POA: Diagnosis not present

## 2016-06-27 DIAGNOSIS — D649 Anemia, unspecified: Secondary | ICD-10-CM

## 2016-06-27 DIAGNOSIS — I214 Non-ST elevation (NSTEMI) myocardial infarction: Secondary | ICD-10-CM

## 2016-06-27 LAB — BASIC METABOLIC PANEL
BUN: 15 mg/dL (ref 7–25)
CHLORIDE: 103 mmol/L (ref 98–110)
CO2: 26 mmol/L (ref 20–31)
Calcium: 9.9 mg/dL (ref 8.6–10.4)
Creat: 0.59 mg/dL (ref 0.50–0.99)
Glucose, Bld: 113 mg/dL — ABNORMAL HIGH (ref 65–99)
POTASSIUM: 5 mmol/L (ref 3.5–5.3)
Sodium: 139 mmol/L (ref 135–146)

## 2016-06-27 LAB — CBC
HEMATOCRIT: 38.9 % (ref 35.0–45.0)
HEMOGLOBIN: 12.8 g/dL (ref 11.7–15.5)
MCH: 30.9 pg (ref 27.0–33.0)
MCHC: 32.9 g/dL (ref 32.0–36.0)
MCV: 94 fL (ref 80.0–100.0)
MPV: 8.7 fL (ref 7.5–12.5)
Platelets: 944 10*3/uL — ABNORMAL HIGH (ref 140–400)
RBC: 4.14 MIL/uL (ref 3.80–5.10)
RDW: 14 % (ref 11.0–15.0)
WBC: 11.8 10*3/uL — ABNORMAL HIGH (ref 3.8–10.8)

## 2016-06-27 MED ORDER — LISINOPRIL 10 MG PO TABS: 10.0000 mg | ORAL_TABLET | Freq: Every day | ORAL | 10 refills | Status: DC

## 2016-06-27 MED ORDER — ATORVASTATIN CALCIUM 80 MG PO TABS: 80.0000 mg | ORAL_TABLET | Freq: Every day | ORAL | 10 refills | Status: DC

## 2016-06-27 MED ORDER — CLOPIDOGREL BISULFATE 75 MG PO TABS: 75.0000 mg | ORAL_TABLET | Freq: Every day | ORAL | 10 refills | Status: DC

## 2016-06-27 MED ORDER — METOPROLOL TARTRATE 25 MG PO TABS: 12.5000 mg | ORAL_TABLET | Freq: Two times a day (BID) | ORAL | 10 refills | Status: DC

## 2016-06-27 NOTE — Progress Notes (Signed)
Cardiology Office Note    Date:  06/27/2016   ID:  Marie AzureSandra K [place]bury, DOB [birth year], MRN [id number]  PCP:  No PCP Per Patient  Cardiologist: Dr. Jens Somrenshaw  Chief Complaint  Patient presents with  . Follow-up    follow up cabg-still has a little soreness    History of Present Illness:    Marie AzureSandra K Graybeal is a 61 y.o. female with past medical history of CAD, COPD, anxiety, and tobacco use who presents to the office today for hospital follow-up.   She was recently admitted from 12/20 - 06/14/2016 for chest pain and found to have multivessel disease by cath with 85% Prox RCA stenosis, 40% Ost LAD, 65% Mid-LAD, 95% 1st Diag, and 99% 2nd Mrg stenosis. Troponin values peaked at 0.40. CT Surgery was consulted and she went for CABG on 12/22 with LIMA-LAD, Free RIMA to D1, SVG-OM1, and SVG-RCA performed by Dr. Cornelius Moraswen. No immediate complications were noted following surgery. Pressor support was weaned as tolerated and she was diuresed over the days following surgery. Labs at time of discharge showed a stable Hgb of 10.3 and creatinine at 0.62. She was started on DAPT in the setting of ACS (Plavix and ASA) along with Atorvastatin 80mg  daily (Lipid Panel with LDL of 124 during admission) and Lopressor 12.5mg  BID.   In talking with the patient today, she reports doing well following her recent surgery. She notes still having generalized fatigue and has not regained her prior surgical activity level, but is able to walk around her home without difficulty. Has not been walking outside for extended periods due to the recent cold weather. She continues to have pain along her surgical site but says using the support pillow along with taking Tylenol has helped with her symptoms. Is not taking Oxycodone. She denies any exertional pain or dyspnea with exertion.   She has quit smoking and was congratulated on this. Continued cessation was encouraged. She reports good compliance with her medications including ASA  and Plavix. Is anxious to start driving again but is aware the final decision regarding this will come from CT Surgery.    Past Medical History:  Diagnosis Date  . Anxiety   . Arthritis    "a little in my right shoulder" (06/08/2016)  . COPD (chronic obstructive pulmonary disease) (HCC)    pt unaware of this dx on 06/08/2016  . 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 05/2016: cath showing multivessel CAD with 85% Prox RCA stenosis, 40% Ost LAD, 65% Mid-LAD, 95% 1st Diag, and 99% 2nd Mrg stenosis. b. 06/09/2016: Underwent CABG with LIMA-LAD, Free RIMA-D1, SVG-OM1, and SVG-RCA  . Emphysema lung (HCC)   . Headache    "stress" (06/08/2016)  . History of blood transfusion 1984   w/childbirth  . NSTEMI (non-ST elevated myocardial infarction) (HCC) 06/07/2016  . Situational depression 2011   "going thru divorce"    Past Surgical History:  Procedure Laterality Date  . CARDIAC CATHETERIZATION  06/08/2016  . CARDIAC CATHETERIZATION N/A 06/08/2016   Procedure: Left Heart Cath and Coronary Angiography;  Surgeon: Marykay Lexavid W Harding, MD;  [facility];  Service: Cardiovascular;  Laterality: N/A;  . CORNEAL LACERATION REPAIR Right 2016   "poked eye 3-4 years earlier; wore patch for awhile; something started growing on cornea & messed w/my vision so they fixed it"  . CORONARY ARTERY BYPASS GRAFT N/A 06/09/2016   Procedure: CORONARY ARTERY BYPASS GRAFTING  x 4 -LIMA to LAD -FREE to DIAGONAL -SVG to OM 1 -SVG to RCA  UTILIZING BILATERAL INTERNAL MAMMARY ARTERY AND ENDOSCOPICALLY HARVESTED RIGHT SAPHENEOUS VEIN;  Surgeon: Purcell Nails, MD;  Location: [facility] OR;  Service: Open Heart Surgery;  Laterality: N/A;  . DILATION AND CURETTAGE OF UTERUS    . EYE SURGERY    . 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 SHOULDER ARTHROSCOPY WITH ROTATOR CUFF REPAIR  Right 03/20/2016   Dr. Rennis Chris  . TEE WITHOUT CARDIOVERSION N/A 06/09/2016   Procedure: TRANSESOPHAGEAL ECHOCARDIOGRAM (TEE);  Surgeon: Purcell Nails, MD;  Location: [facility] OR;  Service: Open Heart Surgery;  Laterality: N/A;    Current Medications: Outpatient Medications Prior to Visit  Medication Sig Dispense Refill  . aspirin EC 81 MG EC tablet Take 1 tablet (81 mg total) by mouth daily.    . COMBIVENT RESPIMAT 20-100 MCG/ACT AERS respimat Inhale 2 puffs into the lungs 2 (two) times daily.  0  . oxyCODONE (OXY IR/ROXICODONE) 5 MG immediate release tablet Take 1-2 tablets (5-10 mg total) by mouth every 6 (six) hours as needed for severe pain. 28 tablet 0  . atorvastatin (LIPITOR) 80 MG tablet Take 1 tablet (80 mg total) by mouth daily at 6 PM. 30 tablet 1  . clopidogrel (PLAVIX) 75 MG tablet Take 1 tablet (75 mg total) by mouth daily. 30 tablet 1  . lisinopril (PRINIVIL,ZESTRIL) 10 MG tablet Take 1 tablet (10 mg total) by mouth daily. 30 tablet 1  . metoprolol tartrate (LOPRESSOR) 25 MG tablet Take 0.5 tablets (12.5 mg total) by mouth 2 (two) times daily. 30 tablet 1   No facility-administered medications prior to visit.      Allergies:   Macrobid [nitrofurantoin macrocrystal]; Sulfa antibiotics; and Hydrocodone   Social History   Social History  . Marital status: Divorced    Spouse name: N/A  . Number of children: N/A  . Years of education: N/A   Social History Main Topics  . Smoking status: Current Every Day Smoker    Packs/day: 1.00    Years: 35.00  . Smokeless tobacco: Never Used  . Alcohol use No  . Drug use: No  . Sexual activity: Yes   Other Topics Concern  . None   Social History Narrative  . None     Family History:  The patient's family history includes Dementia in her mother; Heart disease in her sister; Valvular heart disease in her brother.   Review of Systems:   Please see the history of present illness.     General:  No chills, fever, night sweats or  weight changes. Positive for fatigue.  Cardiovascular:  No dyspnea on exertion, edema, orthopnea, palpitations, paroxysmal nocturnal dyspnea. Positive for generalized soreness along her sternal surgical site.  Dermatological: No rash, lesions/masses Respiratory: No cough, dyspnea Urologic: No hematuria, dysuria Abdominal:   No nausea, vomiting, diarrhea, bright red blood per rectum, melena, or hematemesis Neurologic:  No visual changes, wkns, changes in mental status. All other systems reviewed and are otherwise negative except as noted above.   Physical Exam:    VS:  BP 108/70   Pulse 84   Ht 5\' 3"  (1.6 m)   Wt 116 lb 8 oz (52.8 kg)   BMI 20.64 kg/m    General: Well developed, well nourished Caucasian female appearing in no acute distress. Head: Normocephalic, atraumatic, sclera non-icteric, no xanthomas, nares  are without discharge.  Neck: No carotid bruits. JVD not elevated.  Lungs: Respirations regular and unlabored, without wheezes or rales.  Heart: Regular rate and rhythm. No S3 or S4.  No murmur, no rubs, or gallops appreciated. Surgical suture line appears well-healing with no erythema or drainage noted.  Abdomen: Soft, non-tender, non-distended with normoactive bowel sounds. No hepatomegaly. No rebound/guarding. No obvious abdominal masses. Msk:  Strength and tone appear normal for age. No joint deformities or effusions. Extremities: No clubbing or cyanosis. No edema.  Distal pedal pulses are 2+ bilaterally. Vein graft harvest sites are well-healing. No drainage or hematoma appreciated.  Neuro: Alert and oriented X 3. Moves all extremities spontaneously. No focal deficits noted. Psych:  Responds to questions appropriately with a normal affect. Skin: No rashes or lesions noted  Wt Readings from Last 3 Encounters:  06/27/16 116 lb 8 oz (52.8 kg)  06/14/16 121 lb 14.4 oz (55.3 kg)  12/14/12 123 lb (55.8 kg)     Studies/Labs Reviewed:   EKG:  EKG is ordered today.  The ekg  ordered today demonstrates NSR, HR 85, with TWI in the anterior leads.   Recent Labs: 06/09/2016: ALT 12 06/10/2016: Magnesium 1.9 06/12/2016: BUN 6; Creatinine, Ser 0.62; Hemoglobin 10.3; Platelets 149; Potassium 4.3; Sodium 137   Lipid Panel    Component Value Date/Time   CHOL 193 06/09/2016 0454   TRIG 115 06/09/2016 0454   HDL 58 06/09/2016 0454   CHOLHDL 3.3 06/09/2016 0454   VLDL 23 06/09/2016 0454   LDLCALC 112 (H) 06/09/2016 0454    Additional studies/ records that were reviewed today include:   Cardiac Catheterization: 06/08/2016  LV end diastolic pressure is normal.  The left ventricular systolic function is normal. The left ventricular ejection fraction is 55-65% by visual estimate.  SEVERE MULTIVESSEL CAD:  Prox RCA lesion, 85 %stenosed. Tandem Mid RCA lesion, 90 %stenosed.  Ost LAD lesion, 40 %stenosed. Dist LAD lesion, 40 %stenosed.  Tandem Mid LAD-1 lesion, 65 %stenosed & Mid LAD-2 lesion, 60 %stenosed.  1st Diag lesion, 95 %stenosed.  2nd Mrg lesion, 99 %stenosed. TIMI 2 Flow distally would suggest that this is the Culprit Lesion.   The patient clearly has multivessel disease with severe stenoses in the proximal and mid RCA, proximal OM 2 and D1 as well as tandem moderate to severe lesions in the LAD. Difficult situation as all of these are potentially PCI targets, she deserves to have CT surgical consultation to discuss options as this is also clearly surgical disease..  We have consulted Dr. Cornelius Moras who will see the patient tonight.  Plan:  She will be transferred to [facility] post procedure unit for TR band recovery.  Restart heparin later tonight after TR band removal.  Pending her decision reference CABG versus staged multivessel PCI, would potentially consider OM and D1 PCI with FFR guided PCI of the LAD tomorrow with staged PCI of the RCA at a later date.  Smoking cessation counseling given.  We'll need high-dose statin as well his beta  blocker  Assessment:    1. Coronary artery disease involving native coronary artery of native heart without angina pectoris   2. Non-ST elevation myocardial infarction (NSTEMI), subsequent episode of care (HCC)   3. S/P CABG x 4   4. Hyperlipidemia LDL goal <70   5. Anemia, unspecified type   6. History of tobacco use   7. Medication management      Plan:   In order of problems listed above:  1. Subsequent Episode of Care for NSTEMI with Multivessel CAD, s/p CABG x4 - admitted from 12/20 - 06/14/2016 for chest pain with cath showing multi-vessel CAD. Troponin values peaked at 0.40. Underwent CABG on 12/22 with LIMA-LAD, Free RIMA to D1, SVG-OM1, and SVG-RCA performed by Dr. Cornelius Moras. - reports doing well following her recent surgery. Having incisional pain but no chest discomfort or dyspnea with exertion.  - continue Plavix and ASA in the setting of ACS along with statin and BB. Referral has been made for Cardiac Rehab.   2. HLD - Lipid Panel in 05/2016 showed total cholesterol 193, HDL 58, and LDL 124. - started on Atorvastatin 80mg  daily during recent hospitalization. Goal LDL < 70. - repeat Lipid Panel and LFT's in 6-8 weeks.   3. Post-operative Anemia - Hgb 10.3 at hospital discharge.  - repeat CBC today.   4. Prior Tobacco Use - she has not smoked since her recent surgery.  - congratulated on this and continued cessation was strongly encouraged.     Medication Adjustments/Labs and Tests Ordered: Current medicines are reviewed at length with the patient today.  Concerns regarding medicines are outlined above.  Medication changes, Labs and Tests ordered today are listed in the Patient Instructions below. Patient Instructions  Medication Instructions:  Your physician recommends that you continue on your current medications as directed. Please refer to the Current Medication list given to you today.  Labwork: Your physician recommends that you return for lab work in:  [employer], BMET  Testing/Procedures: NONE   Follow-Up: Your physician recommends that you schedule a follow-up appointment in: 2 MONTHS WITH DR CRENSHAW.  Any Other Special Instructions Will Be Listed Below (If Applicable).  If you need a refill on your cardiac medications before your next appointment, please call your pharmacy.    Signed, Ellsworth Lennox, PA  06/27/2016 2:02 PM    [facility] [address] [place], [address] [place], [place]  [address] Phone: [phone]; Fax: [phone]  [address], [address] [place], [place] [address] Phone: [phone]

## 2016-06-27 NOTE — Patient Instructions (Signed)
Medication Instructions:  Your physician recommends that you continue on your current medications as directed. Please refer to the Current Medication list given to you today.  Labwork: Your physician recommends that you return for lab work in: [employer]-[facility], BMET  Testing/Procedures: NONE   Follow-Up: Your physician recommends that you schedule a follow-up appointment in: 2 MONTHS WITH DR CRENSHAW.  Any Other Special Instructions Will Be Listed Below (If Applicable).   If you need a refill on your cardiac medications before your next appointment, please call your pharmacy.  NEXT APPT:  DATE:__________________________________________________   TIME:______________________________________________AM/PM

## 2016-06-28 LAB — PATHOLOGIST SMEAR REVIEW

## 2016-06-29 ENCOUNTER — Telehealth: Payer: Self-pay | Admitting: *Deleted

## 2016-06-29 DIAGNOSIS — R7989 Other specified abnormal findings of blood chemistry: Secondary | ICD-10-CM

## 2016-06-29 NOTE — Telephone Encounter (Signed)
-----   Message from Ellsworth LennoxBrittany M Strader, [place]PA sent at 06/29/2016  7:55 AM EST ----- Please let the patient know her electrolytes and kidney function remain stable. Anemia has improved (Hgb 10.3 --> 12.8). However, her platelet count (helps with clotting) was significantly elevated. Recommend repeating a CBC in one week as this could be due to the sample itself. Can be obtained at our office or by PCP.

## 2016-06-29 NOTE — Telephone Encounter (Signed)
Pt aware of her lab results.  She will come by the lab for repeat cbc 07/06/16. Pt verbalized understanding.

## 2016-07-03 ENCOUNTER — Other Ambulatory Visit: Payer: Self-pay | Admitting: *Deleted

## 2016-07-03 DIAGNOSIS — R7989 Other specified abnormal findings of blood chemistry: Secondary | ICD-10-CM

## 2016-07-03 LAB — CBC
HEMATOCRIT: 35.8 % (ref 35.0–45.0)
HEMOGLOBIN: 12 g/dL (ref 11.7–15.5)
MCH: 31.3 pg (ref 27.0–33.0)
MCHC: 33.5 g/dL (ref 32.0–36.0)
MCV: 93.2 fL (ref 80.0–100.0)
MPV: 8.8 fL (ref 7.5–12.5)
Platelets: 545 10*3/uL — ABNORMAL HIGH (ref 140–400)
RBC: 3.84 MIL/uL (ref 3.80–5.10)
RDW: 14.1 % (ref 11.0–15.0)
WBC: 12.5 10*3/uL — AB (ref 3.8–10.8)

## 2016-07-14 ENCOUNTER — Other Ambulatory Visit: Payer: Self-pay | Admitting: Thoracic Surgery (Cardiothoracic Vascular Surgery)

## 2016-07-14 DIAGNOSIS — Z951 Presence of aortocoronary bypass graft: Secondary | ICD-10-CM

## 2016-07-17 ENCOUNTER — Ambulatory Visit
Admission: RE | Admit: 2016-07-17 | Discharge: 2016-07-17 | Disposition: A | Discharge status: Home / Self Care | Payer: BLUE CROSS/BLUE SHIELD | Source: Ambulatory Visit | Attending: Thoracic Surgery (Cardiothoracic Vascular Surgery) | Admitting: Thoracic Surgery (Cardiothoracic Vascular Surgery)

## 2016-07-17 ENCOUNTER — Encounter: Payer: Self-pay | Admitting: Physician Assistant

## 2016-07-17 ENCOUNTER — Ambulatory Visit (INDEPENDENT_AMBULATORY_CARE_PROVIDER_SITE_OTHER): Payer: Self-pay | Admitting: Physician Assistant

## 2016-07-17 VITALS — BP 138/82 | Resp 16 | Ht 63.0 in | Wt 119.8 lb

## 2016-07-17 DIAGNOSIS — Z951 Presence of aortocoronary bypass graft: Secondary | ICD-10-CM

## 2016-07-17 DIAGNOSIS — I2510 Atherosclerotic heart disease of native coronary artery without angina pectoris: Secondary | ICD-10-CM

## 2016-07-17 NOTE — Progress Notes (Signed)
There is slight drainage coming from one area at the top of the sternal incision. Otherwise, all operative incisions are very well healed.

## 2016-07-17 NOTE — Progress Notes (Addendum)
  HPI:  Patient returns for routine postoperative follow-up having undergone a CABG x 4 on 06/09/2016 by Dr. Cornelius Moraswen.for Since hospital discharge the patient reports she occasionally wakes up with a headache. She inquired if it is ok to get a steroid injection in her shoulder (she had rotator cuff surgery prior to coronary artery bypass grafting surgery).   Current Outpatient Prescriptions  Medication Sig Dispense Refill  . aspirin EC 81 MG EC tablet Take 1 tablet (81 mg total) by mouth daily.    Marland Kitchen. atorvastatin (LIPITOR) 80 MG tablet Take 1 tablet (80 mg total) by mouth daily at 6 PM. 30 tablet 10  . clopidogrel (PLAVIX) 75 MG tablet Take 1 tablet (75 mg total) by mouth daily. 30 tablet 10  . COMBIVENT RESPIMAT 20-100 MCG/ACT AERS respimat Inhale 2 puffs into the lungs 2 (two) times daily.  0  . lisinopril (PRINIVIL,ZESTRIL) 10 MG tablet Take 1 tablet (10 mg total) by mouth daily. 30 tablet 10  . metoprolol tartrate (LOPRESSOR) 25 MG tablet Take 0.5 tablets (12.5 mg total) by mouth 2 (two) times daily. 30 tablet 10  . oxyCODONE (OXY IR/ROXICODONE) 5 MG immediate release tablet Take 1-2 tablets (5-10 mg total) by mouth every 6 (six) hours as needed for severe pain. (Patient not taking: Reported on 07/17/2016) 28 tablet 0  Vital Signs: BP 138/82 RR 16 , 97% on room air Oxygen saturation   Physical Exam: CV-RRR Pulmonary-Clear to auscultation bilaterally Extremities-No LE edema Wounds-Clean and dry  Diagnostic Tests:  CLINICAL DATA:  Coronary bypass 06/09/2016, shortness of breath with exertion  EXAM: CHEST  2 VIEW  COMPARISON:  06/12/2016  FINDINGS: Previous coronal bypass changes noted. Resolution of the small effusions and basilar atelectasis. Lungs are now clear. No pneumothorax. Normal heart size and vascularity. Trachea is midline. Diffuse thoracic spondylosis noted. No acute compression fracture.  IMPRESSION: Resolved small pleural effusions and basilar  atelectasis.  Postop coronal bypass changes.  No acute chest process.   Electronically Signed   By: Judie PetitM.  Shick M.D.   On: 07/17/2016 12:49  Impression and Plan Overall, she is recovering well from coronary artery bypass grafting surgery. She has already [place] PA-C with cardiology and she has a follow up appointment to see Dr. Jens Somrenshaw in March. The only change I made to her medication was the discontinuance of naproxen for a few more weeks. She was instructed she may begin driving short distances (she admitted to driving herself to this appointment;ie 30 minutes or less during the day) as long as she does not take pain medication prior. She was encouraged to participate in cardiac rehab, which she is agreeable.She was instructed to continue with sternal precautions (i.e. not to lift more than 10 pounds for the next 3-4 weeks.) finally, she states she has not had a ciagarrette since being discharged from the hospital! She will return to see Dr. Cornelius Moraswen in about 3 weeks.    Ardelle BallsZIMMERMAN,Kadian Barcellos M, PA-C [facility] [phone]

## 2016-07-17 NOTE — Patient Instructions (Signed)
You may return to driving an automobile as long as you are no longer requiring oral narcotic pain relievers during the daytime.  It would be wise to start driving only short distances during the daylight and gradually increase from there as you feel comfortable.  You may continue to gradually increase your physical activity as tolerated.  Refrain from any heavy lifting or strenuous use of your arms and shoulders until at least 8 weeks from the time of your surgery, and avoid activities that cause increased pain in your chest on the side of your surgical incision.  Otherwise, you may continue to increase activities without any particular limitations.  Increase the intensity and duration of physical activity gradually.

## 2016-08-07 ENCOUNTER — Ambulatory Visit (INDEPENDENT_AMBULATORY_CARE_PROVIDER_SITE_OTHER): Payer: Self-pay | Admitting: Thoracic Surgery (Cardiothoracic Vascular Surgery)

## 2016-08-07 ENCOUNTER — Encounter: Payer: Self-pay | Admitting: Thoracic Surgery (Cardiothoracic Vascular Surgery)

## 2016-08-07 VITALS — BP 119/72 | HR 75 | Resp 20 | Ht 63.0 in | Wt 119.0 lb

## 2016-08-07 DIAGNOSIS — Z951 Presence of aortocoronary bypass graft: Secondary | ICD-10-CM

## 2016-08-07 DIAGNOSIS — I2510 Atherosclerotic heart disease of native coronary artery without angina pectoris: Secondary | ICD-10-CM

## 2016-08-07 NOTE — Patient Instructions (Signed)
Continue to avoid any heavy lifting or strenuous use of your arms or shoulders for at least a total of three months from the time of surgery.  After three months you may gradually increase how much you lift or otherwise use your arms or chest as tolerated, with limits based upon whether or not activities lead to the return of significant discomfort.  You may return to driving an automobile as long as you are no longer requiring oral narcotic pain relievers during the daytime.  It would be wise to start driving only short distances during the daylight and gradually increase from there as you feel comfortable.  You are encouraged to enroll and participate in the outpatient cardiac rehab program beginning as soon as practical.  Continue all previous medications without any changes at this time  

## 2016-08-07 NOTE — Progress Notes (Signed)
[address]       [name] [address]             [phone]     CARDIOTHORACIC SURGERY OFFICE NOTE  Referring Provider is Marykay Lex, MD  Primary Cardiologist is Lewayne Bunting, MD PCP is No PCP Per Patient   HPI:  Patient is a 62 year old female with history of long-standing tobacco abuse and a strong family history of coronary artery disease who returns to the office today for routine follow-up status post coronary artery bypass grafting 4 on 06/09/2016 for severe three-vessel coronary artery disease status post acute non-ST segment elevation myocardial infarction.  Her early postoperative recovery was uneventful and she was discharged from the hospital on the fifth postoperative day.  She was last seen in follow-up in our office on 07/17/2016 and she returns for routine follow-up today. She reports that she is doing very well. She states that her biggest problem at this time is that of chronic pain in her right shoulder which predated her surgery. Her shoulder has gotten stiffer since she had to temporarily stop physical therapy and she hopes to resume physical therapy as soon as practical. She states that she has gotten over the cardiac surgery remarkably well. She no longer has any significant pain in her chest and she denies any problems with exertional chest discomfort or shortness of breath. She states that she tried smoking a cigarette 2 weeks ago and it immediately made her sick. She is now states with confidence that she has completely stopped smoking.  Her appetite is been slow to improve. She is sleeping well at night. Overall she feels well and is pleased with her progress. She has not yet started the outpatient cardiac rehabilitation program.   Current Outpatient Prescriptions  Medication Sig Dispense Refill  . aspirin EC 81 MG EC tablet Take 1 tablet (81 mg total) by mouth daily.    Marland Kitchen atorvastatin (LIPITOR) 80 MG tablet Take 1 tablet (80 mg  total) by mouth daily at 6 PM. 30 tablet 10  . clopidogrel (PLAVIX) 75 MG tablet Take 1 tablet (75 mg total) by mouth daily. 30 tablet 10  . COMBIVENT RESPIMAT 20-100 MCG/ACT AERS respimat Inhale 2 puffs into the lungs 2 (two) times daily.  0  . lisinopril (PRINIVIL,ZESTRIL) 10 MG tablet Take 1 tablet (10 mg total) by mouth daily. 30 tablet 10  . metoprolol tartrate (LOPRESSOR) 25 MG tablet Take 0.5 tablets (12.5 mg total) by mouth 2 (two) times daily. 30 tablet 10   No current facility-administered medications for this visit.       Physical Exam:   BP 119/72   Pulse 75   Resp 20   Ht 5\' 3"  (1.6 m)   Wt 119 lb (54 kg)   SpO2 93% Comment: RA  BMI 21.08 kg/m   General:  Well-appearing  Chest:   Clear to auscultation  CV:   Regular rate and rhythm  Incisions:  Well-healed, sternum is stable  Abdomen:  Soft nontender  Extremities:  Warm and well-perfused  Diagnostic Tests:  n/a   Impression:  Patient is doing well nearly 2 months status post coronary artery bypass grafting 4 using bilateral internal mammary arteries.  Plan:  We have not recommended any changes to the patient's current medications. She remains on dual antiplatelet therapy at this time because of her acute presentation with unstable angina. At some point Plavix could be discontinued, probably 4 months from  now. She has been encouraged to enroll and participate in the outpatient cardiac rehabilitation program. She may resume physical therapy for her shoulder is long as it doesn't lead to any increased discomfort in her sternum. She has been reminded how important it will remain for her to continue to abstain from any tobacco use. All of her questions been addressed. The patient will continue to follow-up with Dr. Jens Somrenshaw. She will return to our office for routine follow-up next December, approximately 1 year following her surgery. She will call and return sooner only should specific problems or questions  arise.    Salvatore Decentlarence H. Cornelius Moraswen, MD 08/07/2016 4:41 PM

## 2016-08-11 ENCOUNTER — Encounter (HOSPITAL_COMMUNITY): Payer: Self-pay | Admitting: General Practice

## 2016-08-11 NOTE — Progress Notes (Signed)
Mailed patient letter with information about Cardiac Rehab program. MW °

## 2016-08-14 ENCOUNTER — Other Ambulatory Visit: Payer: Self-pay | Admitting: Thoracic Surgery (Cardiothoracic Vascular Surgery)

## 2016-08-21 ENCOUNTER — Other Ambulatory Visit: Payer: Self-pay | Admitting: Cardiology

## 2016-08-21 MED ORDER — LISINOPRIL 10 MG PO TABS: 10.0000 mg | ORAL_TABLET | Freq: Every day | ORAL | 5 refills | Status: DC

## 2016-08-21 MED ORDER — CLOPIDOGREL BISULFATE 75 MG PO TABS: 75.0000 mg | ORAL_TABLET | Freq: Every day | ORAL | 5 refills | Status: DC

## 2016-08-21 MED ORDER — ATORVASTATIN CALCIUM 80 MG PO TABS: 80.0000 mg | ORAL_TABLET | Freq: Every day | ORAL | 5 refills | Status: DC

## 2016-08-21 MED ORDER — METOPROLOL TARTRATE 25 MG PO TABS: 12.5000 mg | ORAL_TABLET | Freq: Two times a day (BID) | ORAL | 5 refills | Status: DC

## 2016-08-21 NOTE — Progress Notes (Signed)
HPI: Follow-up coronary artery disease. Patient admitted December 2017 with chest pain. Cardiac catheterization revealed severe three-vessel coronary artery disease. She did rule in for a non-ST elevation myocardial infarction. Echocardiogram December 2017 showed normal LV function, grade 1 diastolic dysfunction. Patient had coronary artery bypass and graft with a LIMA to the LAD, free RIMA to the first diagonal, saphenous vein graft to first marginal and saphenous vein graft to the right coronary artery. Since last seen, the patient denies any dyspnea on exertion, orthopnea, PND, pedal edema, palpitations, syncope or chest pain.   Current Outpatient Prescriptions  Medication Sig Dispense Refill  . aspirin EC 81 MG EC tablet Take 1 tablet (81 mg total) by mouth daily.    Marland Kitchen atorvastatin (LIPITOR) 80 MG tablet Take 1 tablet (80 mg total) by mouth daily at 6 PM. 30 tablet 5  . clopidogrel (PLAVIX) 75 MG tablet Take 1 tablet (75 mg total) by mouth daily. 30 tablet 5  . COMBIVENT RESPIMAT 20-100 MCG/ACT AERS respimat Inhale 2 puffs into the lungs 2 (two) times daily.  0  . lisinopril (PRINIVIL,ZESTRIL) 10 MG tablet Take 1 tablet (10 mg total) by mouth daily. 30 tablet 5  . metoprolol tartrate (LOPRESSOR) 25 MG tablet Take 0.5 tablets (12.5 mg total) by mouth 2 (two) times daily. 30 tablet 5   No current facility-administered medications for this visit.      Past Medical History:  Diagnosis Date  . Anxiety   . Arthritis    "a little in my right shoulder" (06/08/2016)  . COPD (chronic obstructive pulmonary disease) (HCC)    pt unaware of this dx on 06/08/2016  . 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 05/2016: cath showing multivessel CAD with 85% Prox RCA stenosis, 40% Ost LAD, 65% Mid-LAD, 95% 1st Diag, and 99% 2nd Mrg stenosis. b. 06/09/2016: Underwent CABG with LIMA-LAD, Free RIMA-D1, SVG-OM1, and  SVG-RCA  . Emphysema lung (HCC)   . Headache    "stress" (06/08/2016)  . History of blood transfusion 1984   w/childbirth  . NSTEMI (non-ST elevated myocardial infarction) (HCC) 06/07/2016  . Situational depression 2011   "going thru divorce"    Past Surgical History:  Procedure Laterality Date  . CARDIAC CATHETERIZATION  06/08/2016  . CARDIAC CATHETERIZATION N/A 06/08/2016   Procedure: Left Heart Cath and Coronary Angiography;  Surgeon: Marykay Lex, MD;  Location: [facility] INVASIVE CV LAB;  Service: Cardiovascular;  Laterality: N/A;  . CORNEAL LACERATION REPAIR Right 2016   "poked eye 3-4 years earlier; wore patch for awhile; something started growing on cornea & messed w/my vision so they fixed it"  . CORONARY ARTERY BYPASS GRAFT N/A 06/09/2016   Procedure: CORONARY ARTERY BYPASS GRAFTING x 4 -LIMA to LAD -FREE to DIAGONAL -SVG to OM 1 -SVG to RCA  UTILIZING BILATERAL INTERNAL MAMMARY ARTERY AND ENDOSCOPICALLY HARVESTED RIGHT SAPHENEOUS VEIN;  Surgeon: Purcell Nails, MD;  Location: [facility] OR;  Service: Open Heart Surgery;  Laterality: N/A;  . DILATION AND CURETTAGE OF UTERUS    . EYE SURGERY    . 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 SHOULDER ARTHROSCOPY WITH ROTATOR CUFF REPAIR Right 03/20/2016   Dr. Rennis Chris  . TEE WITHOUT CARDIOVERSION N/A 06/09/2016   Procedure: TRANSESOPHAGEAL ECHOCARDIOGRAM (TEE);  Surgeon: Purcell Nails, MD;  Location[facility] OR;  Service:  Open Heart Surgery;  Laterality: N/A;    Social History   Social History  . Marital status: Divorced    Spouse name: N/A  . Number of children: N/A  . Years of education: N/A   Occupational History  . Not on file.   Social History Main Topics  . Smoking status: Former Smoker    Packs/day: 1.00    Years: 35.00    Quit date: 06/09/2016  . Smokeless tobacco: Never Used     Comment: HAS NOT SMOKED SINCE SURGERY!!!  . Alcohol use No  . Drug use: No  . Sexual  activity: Yes   Other Topics Concern  . Not on file   Social History Narrative  . No narrative on file    Family History  Problem Relation Age of Onset  . Dementia Mother   . Heart disease Sister     CABG?  age 62?  Marland Kitchen. Valvular heart disease Brother     ROS: Some sinusitis but no fevers or chills, productive cough, hemoptysis, dysphasia, odynophagia, melena, hematochezia, dysuria, hematuria, rash, seizure activity, orthopnea, PND, pedal edema, claudication. Remaining systems are negative.  Physical Exam: Well-developed well-nourished in no acute distress.  Skin is warm and dry.  HEENT is normal.  Neck is supple.  Chest is clear to auscultation with normal expansion. Sternotomy without evidence of infection Cardiovascular exam is regular rate and rhythm.  Abdominal exam nontender or distended. No masses palpated. Extremities show no edema. neuro grossly intact    A/P  1 coronary artery disease-continue aspirin and statin. Patient did rule in for a non-ST elevation myocardial infarction at time of presentation in December. We will continue with Plavix until December 2017.  2 hyperlipidemia-continue statin. Check lipids and liver.  3 tobacco abuse-patient counseled on discontinuing.  4 Hypertension-blood pressure elevated but she states typically controlled. She will follow this at home and we will advance lisinopril if needed.  Olga MillersBrian Leng Montesdeoca, MD

## 2016-08-21 NOTE — Telephone Encounter (Signed)
°*  STAT* If patient is at the pharmacy, call can be transferred to refill team.   1. Which medications need to be refilled? (please list name of each medication and dose if known)Metoprolol 25mg ; Lisinopril 10mg ; Clopidogrel 75mg  and Atorvastatin 80mg .  2. Which pharmacy/location (including street and city if local pharmacy) is medication to be sent to?[facility] on  [address]  3. Do they need a 30 day or 90 day supply? 30

## 2016-08-21 NOTE — Telephone Encounter (Signed)
Rx(s) sent to pharmacy electronically.  

## 2016-08-25 ENCOUNTER — Encounter: Payer: Self-pay | Admitting: Cardiology

## 2016-08-25 ENCOUNTER — Ambulatory Visit (INDEPENDENT_AMBULATORY_CARE_PROVIDER_SITE_OTHER): Payer: BLUE CROSS/BLUE SHIELD | Admitting: Cardiology

## 2016-08-25 VITALS — BP 148/82 | HR 80 | Ht 63.0 in | Wt 125.0 lb

## 2016-08-25 DIAGNOSIS — I25810 Atherosclerosis of coronary artery bypass graft(s) without angina pectoris: Secondary | ICD-10-CM | POA: Diagnosis not present

## 2016-08-25 DIAGNOSIS — I10 Essential (primary) hypertension: Secondary | ICD-10-CM | POA: Diagnosis not present

## 2016-08-25 DIAGNOSIS — E7800 Pure hypercholesterolemia, unspecified: Secondary | ICD-10-CM

## 2016-08-25 LAB — HEPATIC FUNCTION PANEL
ALT: 11 U/L (ref 6–29)
AST: 14 U/L (ref 10–35)
Albumin: 3.8 g/dL (ref 3.6–5.1)
Alkaline Phosphatase: 93 U/L (ref 33–130)
BILIRUBIN DIRECT: 0.1 mg/dL (ref <=0.2)
BILIRUBIN INDIRECT: 0.3 mg/dL (ref 0.2–1.2)
BILIRUBIN TOTAL: 0.4 mg/dL (ref 0.2–1.2)
Total Protein: 7 g/dL (ref 6.1–8.1)

## 2016-08-25 LAB — LIPID PANEL
CHOLESTEROL: 144 mg/dL (ref <200)
HDL: 57 mg/dL (ref >50)
LDL Cholesterol: 73 mg/dL (ref <100)
Total CHOL/HDL Ratio: 2.5 Ratio (ref <5.0)
Triglycerides: 69 mg/dL (ref <150)
VLDL: 14 mg/dL (ref <30)

## 2016-08-25 NOTE — Patient Instructions (Signed)

## 2016-08-28 ENCOUNTER — Encounter: Payer: Self-pay | Admitting: *Deleted

## 2016-11-21 ENCOUNTER — Telehealth: Payer: Self-pay | Admitting: Cardiology

## 2016-11-21 NOTE — Telephone Encounter (Signed)
New message ° ° ° °Pt is calling asking for a call back about her medications. °

## 2016-11-21 NOTE — Telephone Encounter (Signed)
Spoke with pt, she has lost her insurance and her disability. She does not have any income at present. She was asking for generics to replace her current prescriptions. Patient made aware all of her current medications are generic.

## 2017-03-27 ENCOUNTER — Other Ambulatory Visit: Payer: Self-pay | Admitting: Cardiology

## 2017-05-17 ENCOUNTER — Other Ambulatory Visit: Payer: Self-pay | Admitting: Cardiology

## 2017-05-17 NOTE — Progress Notes (Signed)
Cardiology Office Note    Date:  05/19/2017   ID:  Marie Young, DOB 1956-02-25, MRN [id number]  PCP:  Patient, No Pcp Per  Cardiologist: Dr. Jens Som  Chief Complaint  Patient presents with  . Follow-up    6 month visit    History of Present Illness:    Marie Young is a 61 y.o. female with past medical history of CAD (s/p NSTEMI in 05/2016 with cath showing 3-vessel CAD --> underwent CABG with LIMA-LAD, Free RIMA-D1, SVG-OM1, and SVG-RCA), HTN, HLD, tobacco use and COPD who presents to the office today for 110-month follow-up.  She was last examined by Dr. Jens Som in 08/2016 and denied any recent chest pain or dyspnea on exertion at that time. Was recommended she continue on Plavix until 05/2016 then be on ASA alone.   In talking with the patient today, she reports doing well from a cardiac perspective since her last office visit.  She denies any recent chest discomfort, dyspnea on exertion, orthopnea, PND, lower extremity edema, or palpitations.  She has been suffering from sinus congestion and pain along her right maxillary sinus. Denies any fever or chills. Has been having significant nasal mucous. She was evaluated [facility] several months ago and prescribed an antibiotic at that time for sinusitis and says this significantly helped with her symptoms but they returned within 4 weeks. She reports completing her entire course of antibiotics at that time. She has been taking Mucinex and has completed over 3 boxes of the medication without resolution of her symptoms. She is unsure if this is plain Mucinex or Mucinex DM. Her blood pressure is elevated at 159/87 during today's visit, slightly improved to 152/78 on recheck.  One of her main stressors is finances as she lost her job following her surgery. She is divorced and moved in with her daughter and grandchildren. She has been unable to locate similar work. She does not have a PCP.   Past Medical History:    Diagnosis Date  . Anxiety   . Arthritis    "a little in my right shoulder" (06/08/2016)  . COPD (chronic obstructive pulmonary disease) (HCC)    pt unaware of this dx on 06/08/2016  . 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 05/2016: cath showing multivessel CAD with 85% Prox RCA stenosis, 40% Ost LAD, 65% Mid-LAD, 95% 1st Diag, and 99% 2nd Mrg stenosis. b. 06/09/2016: Underwent CABG with LIMA-LAD, Free RIMA-D1, SVG-OM1, and SVG-RCA  . Emphysema lung (HCC)   . Headache    "stress" (06/08/2016)  . History of blood transfusion 1984   w/childbirth  . NSTEMI (non-ST elevated myocardial infarction) (HCC) 06/07/2016  . Situational depression 2011   "going thru divorce"    Past Surgical History:  Procedure Laterality Date  . CARDIAC CATHETERIZATION  06/08/2016  . CARDIAC CATHETERIZATION N/A 06/08/2016   Procedure: Left Heart Cath and Coronary Angiography;  Surgeon: Marykay Lex, MD;  Location: [facility] INVASIVE CV LAB;  Service: Cardiovascular;  Laterality: N/A;  . CORNEAL LACERATION REPAIR Right 2016   "poked eye 3-4 years earlier; wore patch for awhile; something started growing on cornea & messed w/my vision so they fixed it"  . CORONARY ARTERY BYPASS GRAFT N/A 06/09/2016   Procedure: CORONARY ARTERY BYPASS GRAFTING x 4 -LIMA to LAD -FREE to DIAGONAL -SVG to OM 1 -SVG to RCA  UTILIZING BILATERAL INTERNAL MAMMARY ARTERY AND ENDOSCOPICALLY HARVESTED  RIGHT SAPHENEOUS VEIN;  Surgeon: Purcell Nailslarence H Owen, MD;  [facility];  Service: Open Heart Surgery;  Laterality: N/A;  . DILATION AND CURETTAGE OF UTERUS    . EYE SURGERY    . 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 SHOULDER ARTHROSCOPY WITH ROTATOR CUFF REPAIR Right 03/20/2016   Dr. Rennis ChrisSupple  . TEE WITHOUT CARDIOVERSION N/A 06/09/2016   Procedure: TRANSESOPHAGEAL ECHOCARDIOGRAM (TEE);  Surgeon: Purcell Nailslarence H  Owen, MD;  [facility];  Service: Open Heart Surgery;  Laterality: N/A;    Current Medications: Outpatient Medications Prior to Visit  Medication Sig Dispense Refill  . aspirin EC 81 MG EC tablet Take 1 tablet (81 mg total) by mouth daily.    . clopidogrel (PLAVIX) 75 MG tablet TAKE 1 TABLET BY MOUTH EVERY DAY 30 tablet 5  . COMBIVENT RESPIMAT 20-100 MCG/ACT AERS respimat Inhale 2 puffs into the lungs 2 (two) times daily.  0  . lisinopril (PRINIVIL,ZESTRIL) 10 MG tablet Take 1 tablet (10 mg total) by mouth daily. 90 tablet 0  . atorvastatin (LIPITOR) 80 MG tablet Take 1 tablet (80 mg total) by mouth daily at 6 PM. 30 tablet 5  . metoprolol tartrate (LOPRESSOR) 25 MG tablet Take 0.5 tablets (12.5 mg total) by mouth 2 (two) times daily. 30 tablet 5   No facility-administered medications prior to visit.      Allergies:   Macrobid [nitrofurantoin macrocrystal]; Sulfa antibiotics; and Hydrocodone   Social History   Socioeconomic History  . Marital status: Divorced    Spouse name: None  . Number of children: None  . Years of education: None  . Highest education level: None  Social Needs  . Financial resource strain: None  . Food insecurity - worry: None  . Food insecurity - inability: None  . Transportation needs - medical: None  . Transportation needs - non-medical: None  Occupational History  . None  Tobacco Use  . Smoking status: Former Smoker    Packs/day: 0.50    Years: 35.00    Pack years: 17.50    Last attempt to quit: 06/09/2016    Years since quitting: 0.9  . Smokeless tobacco: Never Used  Substance and Sexual Activity  . Alcohol use: No  . Drug use: No  . Sexual activity: Yes  Other Topics Concern  . None  Social History Narrative  . None     Family History:  The patient's family history includes Dementia in her mother; Heart disease in her sister; Valvular heart disease in her brother.   Review of Systems:   Please see the history of present illness.       General:  No chills, fever, night sweats or weight changes. Positive for sinus congestion.  Cardiovascular:  No chest pain, dyspnea on exertion, edema, orthopnea, palpitations, paroxysmal nocturnal dyspnea. Dermatological: No rash, lesions/masses Respiratory: No cough, dyspnea Urologic: No hematuria, dysuria Abdominal:   No nausea, vomiting, diarrhea, bright red blood per rectum, melena, or hematemesis Neurologic:  No visual changes, wkns, changes in mental status.  All other systems reviewed and are otherwise negative except as noted above.   Physical Exam:    VS:  BP (!) 152/78   Pulse 85   Ht 5\' 4"  (1.626 m)   Wt 137 lb 6.4 oz (62.3 kg)   BMI 23.58 kg/m    General: Well developed, well nourished Caucasian female appearing in no acute distress.  Head: Normocephalic, atraumatic, sclera non-icteric, no xanthomas, nares are without discharge. Tender to palpation along right maxillary sinus.  Neck: No carotid bruits. JVD not elevated.  Lungs: Respirations regular and unlabored, without wheezes or rales.  Heart: Regular rate and rhythm. No S3 or S4.  No murmur, no rubs, or gallops appreciated. Abdomen: Soft, non-tender, non-distended with normoactive bowel sounds. No hepatomegaly. No rebound/guarding. No obvious abdominal masses. Msk:  Strength and tone appear normal for age. No joint deformities or effusions. Extremities: No clubbing or cyanosis. No lower extremity edema.  Distal pedal pulses are 2+ bilaterally. Neuro: Alert and oriented X 3. Moves all extremities spontaneously. No focal deficits noted. Psych:  Responds to questions appropriately with a normal affect. Skin: No rashes or lesions noted  Wt Readings from Last 3 Encounters:  05/18/17 137 lb 6.4 oz (62.3 kg)  08/25/16 125 lb (56.7 kg)  08/07/16 119 lb (54 kg)     Studies/Labs Reviewed:   EKG:  EKG is ordered today.  The ekg ordered today demonstrates NSR, HR 85, with no acute ST or T-wave changes when compared to  prior tracings.   Recent Labs: 06/10/2016: Magnesium 1.9 06/27/2016: BUN 15; Creat 0.59; Potassium 5.0; Sodium 139 07/03/2016: Hemoglobin 12.0; Platelets 545 08/25/2016: ALT 11   Lipid Panel    Component Value Date/Time   CHOL 144 08/25/2016 1021   TRIG 69 08/25/2016 1021   HDL 57 08/25/2016 1021   CHOLHDL 2.5 08/25/2016 1021   VLDL 14 08/25/2016 1021   LDLCALC 73 08/25/2016 1021    Additional studies/ records that were reviewed today include:   Echocardiogram: 05/2016 Study Conclusions  - Left ventricle: The cavity size was normal. Systolic function was   normal. The estimated ejection fraction was in the range of 60%   to 65%. Wall motion was normal; there were no regional wall   motion abnormalities. Doppler parameters are consistent with   abnormal left ventricular relaxation (grade 1 diastolic   dysfunction). Doppler parameters are consistent with   indeterminate ventricular filling pressure. - Aortic valve: There was no regurgitation. - Mitral valve: Transvalvular velocity was within the normal range.   There was no evidence for stenosis. There was no regurgitation. - Right ventricle: The cavity size was normal. Wall thickness was   normal. Systolic function was normal. - Tricuspid valve: There was no regurgitation.   Cardiac Catheterization: 05/2016  LV end diastolic pressure is normal.  The left ventricular systolic function is normal. The left ventricular ejection fraction is 55-65% by visual estimate.  SEVERE MULTIVESSEL CAD:  Prox RCA lesion, 85 %stenosed. Tandem Mid RCA lesion, 90 %stenosed.  Ost LAD lesion, 40 %stenosed. Dist LAD lesion, 40 %stenosed.  Tandem Mid LAD-1 lesion, 65 %stenosed & Mid LAD-2 lesion, 60 %stenosed.  1st Diag lesion, 95 %stenosed.  2nd Mrg lesion, 99 %stenosed. TIMI 2 Flow distally would suggest that this is the Culprit Lesion.   The patient clearly has multivessel disease with severe stenoses in the proximal and mid RCA,  proximal OM 2 and D1 as well as tandem moderate to severe lesions in the LAD. Difficult situation as all of these are potentially PCI targets, she deserves to have CT surgical consultation to discuss options as this is also clearly surgical disease..   Assessment:    1. Coronary artery disease involving native coronary artery of native heart without angina pectoris   2. Essential hypertension   3. Pure hypercholesterolemia   4. Maxillary sinusitis, unspecified chronicity   5. Tobacco  use      Plan:   In order of problems listed above:  1. CAD - s/p NSTEMI in 05/2016 with cath showing 3-vessel CAD --> underwent CABG with LIMA-LAD, Free RIMA-D1, SVG-OM1, and SVG-RCA.  - she denies any recent chest pain or dyspnea on exertion. EKG shows no acute ischemic changes. - continue ASA, statin, and BB. Stop Plavix on 06/08/2017 (1 year-out from NSTEMI as previously recommended by Dr. Jens Somrenshaw).   2. HTN -BP is elevated at 159/87 during today's visit, at 152/78 on recheck, - she has been taking Mucinex DM for several months which is likely playing a factor in her elevated BP. Recommended using only regular Mucinex.  - she will continue to follow BP at home and I recommended she call back to let us known if this remains elevated after stopping the medication as we could further titrate her Lisinopril.  3. HLD - Lipid Panel in 08/2016 showed total cholesterol of 144, HDL 57, and LDL 73. Goal LDL is < 70 with known CAD. - continue Atorvastatin 80mg  daily.   4. Maxillary Sinusitis -  She reports having sinus congestion and pain along her right maxillary sinus for the past several months. Denies any fever or chills. Has been having significant nasal mucous. She has been taking Mucinex and has completed over 3 boxes of the medication without resolution of her symptoms.  - will write for a one-time course of Augmentin as symptoms are concerning for bacterial sinusitis. She does not currently have a  PCP and is without insurance. We provided the patient with a list of [facility] resources at today's visit. I recommended she reach out to an Urgent Care or Health Department if symptoms persist.   5. Tobacco Use - she is still smoking 0.5 ppd. Cessation advised.     Medication Adjustments/Labs and Tests Ordered: Current medicines are reviewed at length with the patient today.  Concerns regarding medicines are outlined above.  Medication changes, Labs and Tests ordered today are listed in the Patient Instructions below. Patient Instructions  STOP Plavix June 08, 2017  [place] has recommended taking Augmentin. A printed prescription has been provided.  -- there are coupons for [employer] & [facility] -- you can contact [facility] [phone]  or [facility] [phone] Outpatient Pharmacy for prices  Your physician wants you to follow-up in: 6 months with Dr. Jens Somrenshaw. You will receive a reminder letter in the mail two months in advance. If you don't receive a letter, please call our office to schedule the follow-up appointment.   Signed, Ellsworth LennoxBrittany M Soniya Ashraf, PA-C  05/19/2017 10:00 AM    [facility] [address] [place], [place]  [address] Phone: [phone](336) [phone]; Fax: [phone]  [address], [address] [place], [place] [address] Phone: [phone]

## 2017-05-18 ENCOUNTER — Encounter: Payer: Self-pay | Admitting: Student

## 2017-05-18 ENCOUNTER — Ambulatory Visit (INDEPENDENT_AMBULATORY_CARE_PROVIDER_SITE_OTHER): Payer: Self-pay | Admitting: Student

## 2017-05-18 ENCOUNTER — Other Ambulatory Visit: Payer: Self-pay | Admitting: Cardiology

## 2017-05-18 VITALS — BP 152/78 | HR 85 | Ht 64.0 in | Wt 137.4 lb

## 2017-05-18 DIAGNOSIS — Z720 Tobacco use: Secondary | ICD-10-CM

## 2017-05-18 DIAGNOSIS — I2510 Atherosclerotic heart disease of native coronary artery without angina pectoris: Secondary | ICD-10-CM

## 2017-05-18 DIAGNOSIS — J320 Chronic maxillary sinusitis: Secondary | ICD-10-CM

## 2017-05-18 DIAGNOSIS — I10 Essential (primary) hypertension: Secondary | ICD-10-CM

## 2017-05-18 DIAGNOSIS — E7800 Pure hypercholesterolemia, unspecified: Secondary | ICD-10-CM

## 2017-05-18 MED ORDER — METOPROLOL TARTRATE 25 MG PO TABS: 12.5000 mg | ORAL_TABLET | Freq: Two times a day (BID) | ORAL | 3 refills | Status: DC

## 2017-05-18 MED ORDER — AMOXICILLIN-POT CLAVULANATE 875-125 MG PO TABS: 1.0000 | ORAL_TABLET | Freq: Two times a day (BID) | ORAL | 0 refills | Status: DC

## 2017-05-18 MED ORDER — ATORVASTATIN CALCIUM 80 MG PO TABS: 80.0000 mg | ORAL_TABLET | Freq: Every day | ORAL | 3 refills | Status: DC

## 2017-05-18 MED ORDER — AMOXICILLIN-POT CLAVULANATE 875-125 MG PO TABS: 1.0000 | ORAL_TABLET | Freq: Two times a day (BID) | ORAL | 0 refills | Status: AC

## 2017-05-18 NOTE — Patient Instructions (Signed)
STOP Plavix June 08, 2017  [place], [place] has recommended taking Augmentin. A printed prescription has been provided.  -- there are coupons for [employer] & [facility] -- you can contact [facility] [phone](336) [phone]  or [facility] [phone] Outpatient Pharmacy for prices   Your physician wants you to follow-up in: 6 months with Dr. Jens Somrenshaw. You will receive a reminder letter in the mail two months in advance. If you don't receive a letter, please call our office to schedule the follow-up appointment.

## 2017-05-19 ENCOUNTER — Encounter: Payer: Self-pay | Admitting: Student

## 2017-06-01 ENCOUNTER — Telehealth: Payer: Self-pay | Admitting: Student

## 2017-06-01 NOTE — Telephone Encounter (Signed)
    I informed the patient at her office visit that she would need to follow-up with an Urgent Care Provider or PCP if symptoms did not improve. If a full course of antibiotics did not help, then prescribing the same medication is not likely to offer any benefit. She should go to an Urgent Care or be referred to a PCP (can reach out to [facility] at [phone]).  Thanks,  [place]

## 2017-06-01 NOTE — Telephone Encounter (Signed)
New message    Patient is calling to request new prescription for Augmentin for sinus issues. She states she has gotten worse since last visit. Patient does not have PCP.   Please call.

## 2017-06-01 NOTE — Telephone Encounter (Signed)
Pt of Dr. Jens Somrenshaw - most recently seen by Randall AnBrittany Strader PA Spoke to patient.  Discussed that she is still having concerns for sinus issues - right sided - that do not seem to be resolving. She is asking for additional course of antibiotics, if advised. She understands it may be necessary to go to urgent care for eval (she currently does not have a PCP). Pt explains she is out of work, not currently insured, has no money to go to urgent care for evaluation, so is asking if we can help in this regard. Acknowledged, expressed my concern for her situation, and informed her I would route msg to provider to address as able. Pt voiced thanks.

## 2017-06-04 ENCOUNTER — Encounter: Payer: BLUE CROSS/BLUE SHIELD | Admitting: Thoracic Surgery (Cardiothoracic Vascular Surgery)

## 2017-06-04 NOTE — Telephone Encounter (Signed)
Left message of recommendations for patient to go to urgent care if still having problems.

## 2017-06-18 ENCOUNTER — Encounter: Payer: Self-pay | Admitting: Thoracic Surgery (Cardiothoracic Vascular Surgery)

## 2017-06-25 ENCOUNTER — Encounter: Payer: Self-pay | Admitting: Thoracic Surgery (Cardiothoracic Vascular Surgery)

## 2017-07-24 ENCOUNTER — Encounter: Payer: Self-pay | Admitting: Gastroenterology

## 2017-07-30 ENCOUNTER — Encounter: Payer: Self-pay | Admitting: Thoracic Surgery (Cardiothoracic Vascular Surgery)

## 2017-08-27 ENCOUNTER — Ambulatory Visit (INDEPENDENT_AMBULATORY_CARE_PROVIDER_SITE_OTHER): Payer: Self-pay | Admitting: Thoracic Surgery (Cardiothoracic Vascular Surgery)

## 2017-08-27 ENCOUNTER — Encounter: Payer: Self-pay | Admitting: Thoracic Surgery (Cardiothoracic Vascular Surgery)

## 2017-08-27 VITALS — BP 160/90 | HR 80 | Resp 20 | Ht 64.0 in | Wt 137.0 lb

## 2017-08-27 DIAGNOSIS — I2510 Atherosclerotic heart disease of native coronary artery without angina pectoris: Secondary | ICD-10-CM

## 2017-08-27 DIAGNOSIS — Z951 Presence of aortocoronary bypass graft: Secondary | ICD-10-CM

## 2017-08-27 NOTE — Progress Notes (Signed)
[address]       [name] [address]             [phone]     CARDIOTHORACIC SURGERY OFFICE NOTE  Referring Provider is Marykay Lex, MD  Primary Cardiologist is Lewayne Bunting, MD PCP is Patient, No Pcp Per   HPI:  Patient is a 62 year old female with history of long-standing tobacco abuse and a strong family history of coronary artery disease who returns the office today for routine follow-up more than 1 year status post coronary artery bypass grafting x4 on June 09, 2016 for severe three-vessel coronary artery disease status post acute non-ST segment elevation myocardial infarction.  She was last seen here in our office on August 07, 2016 at which time she was doing well.  Since then she has continued to do very well from a cardiac standpoint.  She has been seen in follow-up recently at Dr. Ludwig Clarks office and Plavix was discontinued.  She reports that she is doing very well, although she remains out of work at this time and she admits that she is gone back to smoking cigarettes.  She denies any exertional chest pain or chest tightness.   Current Outpatient Medications  Medication Sig Dispense Refill  . aspirin EC 81 MG EC tablet Take 1 tablet (81 mg total) by mouth daily.    Marland Kitchen atorvastatin (LIPITOR) 80 MG tablet Take 1 tablet (80 mg total) by mouth daily at 6 PM. 90 tablet 3  . COMBIVENT RESPIMAT 20-100 MCG/ACT AERS respimat Inhale 2 puffs into the lungs 2 (two) times daily.  0  . lisinopril (PRINIVIL,ZESTRIL) 10 MG tablet Take 1 tablet (10 mg total) by mouth daily. 90 tablet 0  . metoprolol tartrate (LOPRESSOR) 25 MG tablet Take 0.5 tablets (12.5 mg total) by mouth 2 (two) times daily. 90 tablet 3   No current facility-administered medications for this visit.       Physical Exam:   BP (!) 160/90 (BP Location: Left Arm, Cuff Size: Normal)   Pulse 80   Resp 20   Ht 5\' 4"  (1.626 m)   Wt 137 lb (62.1 kg)   SpO2 97% Comment: RA  BMI  23.52 kg/m   General:  Well-appearing  Chest:   Clear to auscultation  CV:   Regular rate and rhythm without murmur  Incisions:  Completely healed, sternum is stable  Abdomen:  Soft nontender  Extremities:  Warm and well perfused  Diagnostic Tests:  n/a   Impression:  Patient is doing well more than 1 year status post coronary artery bypass grafting.  Her blood pressure is slightly elevated today.  Unfortunately she has resumed smoking cigarettes.  Plan:  We have not recommended any change the patient's current medications.  I strongly recommended that the patient find a way to quit smoking.  I also suggested that she should check her blood pressure daily for a period of time and discussed with Dr. Jens Som whether or not blood pressure medication should be changed or increased.  The many benefits associated with regular exercise and a heart healthy diet have been emphasized.  All of her questions have been answered.  In the future she will call and return to see [place] only should specific problems or questions arise.  I spent in excess of 10 minutes during the conduct of this office consultation and >50% of this time involved direct face-to-face encounter with the patient for counseling and/or coordination of their  care.   Salvatore Decentlarence H. Cornelius Moraswen, MD 08/27/2017 1:00 PM

## 2017-08-27 NOTE — Patient Instructions (Addendum)
Stop smoking immediately and permanently.  Continue all previous medications without any changes at this time.    Check your blood pressure every morning when you get up and make a record so that you may discuss with Dr Jens Somrenshaw whether or not your blood pressure medications should be changed or increased  Make every effort to stay physically active, get some type of exercise on a regular basis, and stick to a "heart healthy diet".  The long term benefits for regular exercise and a healthy diet are critically important to your overall health and wellbeing.

## 2017-08-30 ENCOUNTER — Telehealth: Payer: Self-pay | Admitting: Student

## 2017-08-30 DIAGNOSIS — I25110 Atherosclerotic heart disease of native coronary artery with unstable angina pectoris: Secondary | ICD-10-CM

## 2017-08-30 DIAGNOSIS — Z951 Presence of aortocoronary bypass graft: Secondary | ICD-10-CM

## 2017-08-30 NOTE — Telephone Encounter (Signed)
Patient said that per Dr. Cornelius Moraswen, she was instructed to contact our office r/e elevated blood pressures. Patient said she checks her BP daily at home and readings are usually around 150/90. No c/o dizziness, chest pain or sob. Patient is requesting that her lisinopril be adjusted based on her last office visit with [place] Strad[place]. Patient advised that message would be sent with this request.

## 2017-08-30 NOTE — Telephone Encounter (Signed)
New Message:     Pt said her blood was up yesterday,it was 170/90.She saw Dr Barry Dieneswens yesterday and he told her to contact her Cardiologist so he can adjust her medicine.

## 2017-08-30 NOTE — Telephone Encounter (Signed)
If BP has remained elevated, would recommend increasing Lisinopril to 20 mg daily. She will need a repeat BMET in 2 weeks to assess K+ levels and kidney function with the dose adjustment.  Thanks,  Ellsworth LennoxBrittany M Talor Cheema, PA-C 08/30/2017, 2:21 PM

## 2017-08-31 MED ORDER — LISINOPRIL 20 MG PO TABS: 20.0000 mg | ORAL_TABLET | Freq: Every day | ORAL | 0 refills | Status: DC

## 2017-08-31 NOTE — Telephone Encounter (Signed)
Patient informed and verbalized understanding of plan. 

## 2017-09-23 ENCOUNTER — Other Ambulatory Visit: Payer: Self-pay | Admitting: Cardiology

## 2017-10-17 ENCOUNTER — Telehealth: Payer: Self-pay | Admitting: Cardiology

## 2017-10-17 NOTE — Telephone Encounter (Signed)
Spoke with Pt and informed of the risk of long term use of Naproxen with taking ASA 81 mg daily. Pt advised to contact Ortho Surgeon or PCP for alternative pain medication. Pt verbalized understanding.

## 2017-10-17 NOTE — Telephone Encounter (Signed)
New Message  Pt c/o medication issue:  1. Name of Medication: naproxen   2. How are you currently taking this medication (dosage and times per day)? n/a  3. Are you having a reaction (difficulty breathing--STAT)? no  4. What is your medication issue? Pt states that she had a shoulder surgery that didn't take and wants to know if she can continue to take naproxen or be prescribed an alternative. Please call

## 2017-12-03 ENCOUNTER — Other Ambulatory Visit: Payer: Self-pay | Admitting: Student

## 2018-05-02 IMAGING — CR DG CHEST 1V PORT
1 series · 1 of 1 positions shown · non-contrast
Comparison: 06/09/2016

CLINICAL DATA: Shortness of breath.  Status post CABG.

EXAM:
PORTABLE CHEST 1 VIEW

[AP]
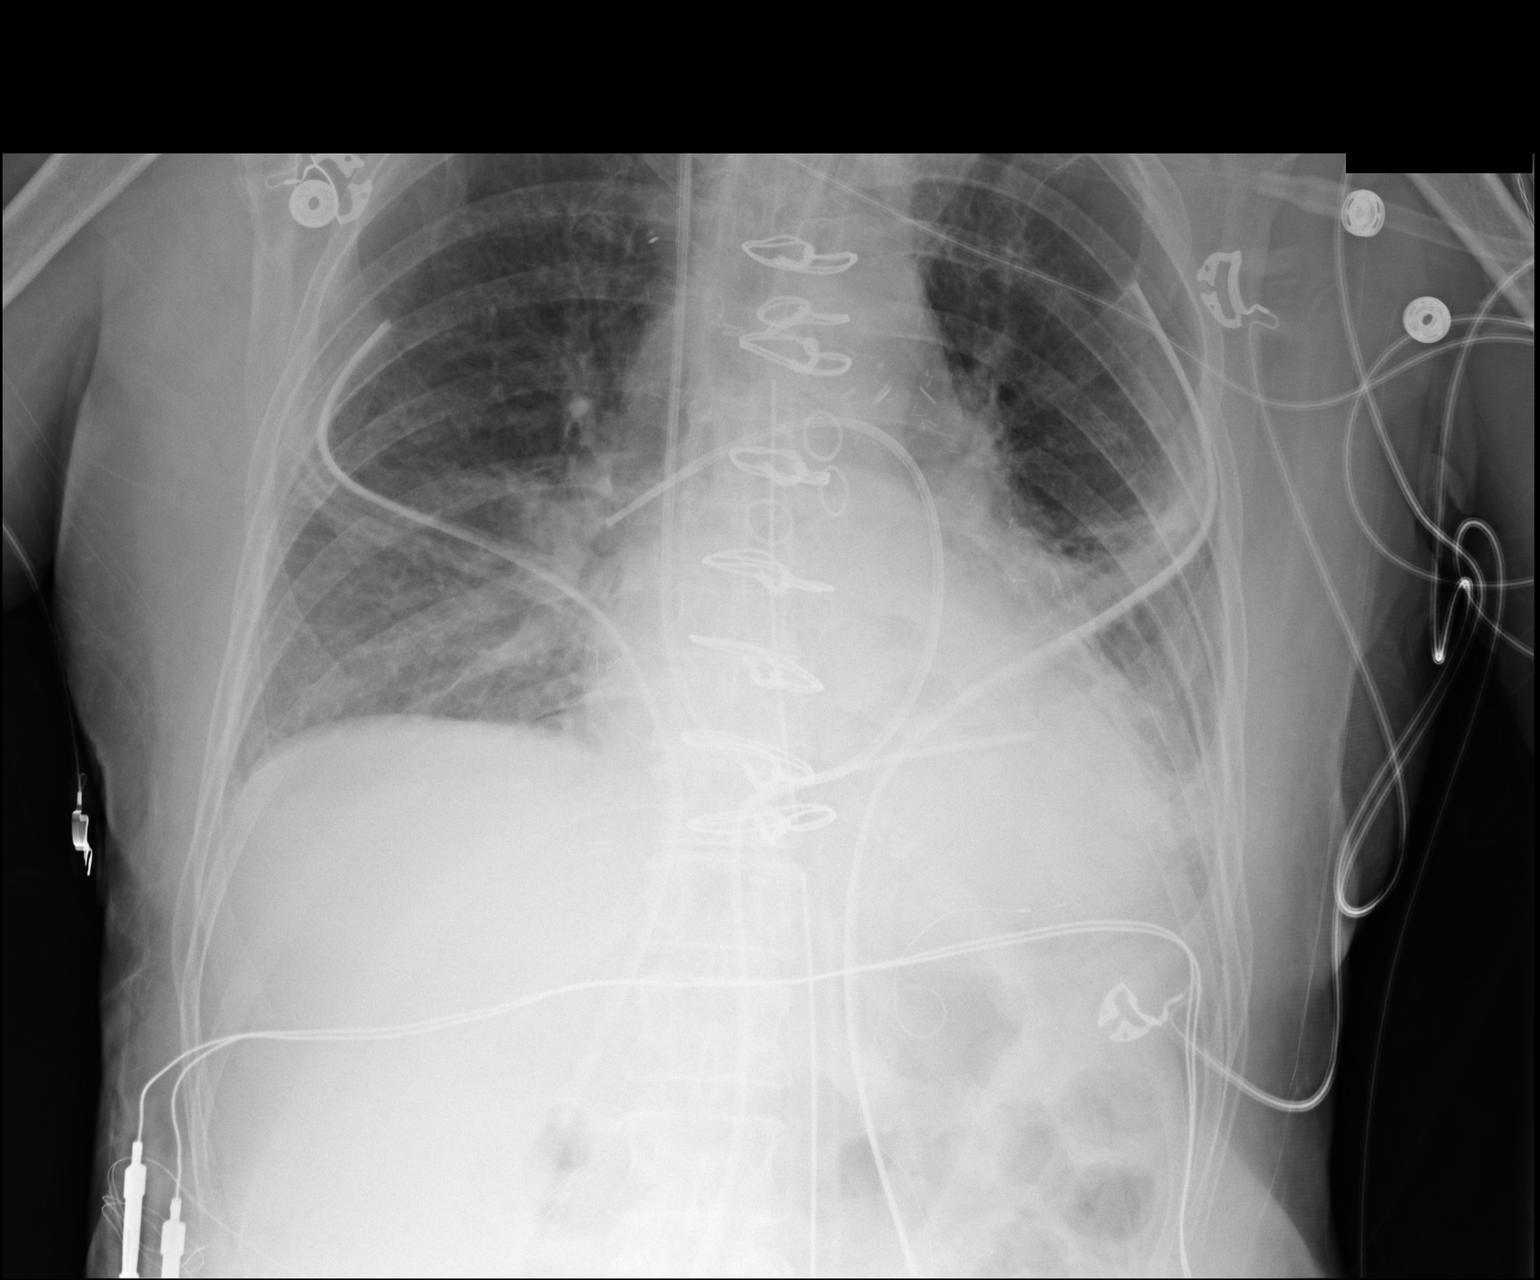

[1 of 1 positions shown; findings below may reference images not displayed]

FINDINGS: Swan-Ganz catheter is extending into the right lower lobe pulmonary
artery and appears stable. Bilateral chest tubes and mediastinal
drains are again noted. The endotracheal tube and nasogastric tube
have been removed. Negative for pneumothorax. Increased densities at
the left lung base are most compatible with atelectasis. Heart size
is within normal limits. Median sternotomy wires and post CABG
changes.
IMPRESSION: Increased densities at left lung base are most compatible with
atelectasis.

Support apparatuses as described.  Negative for a pneumothorax.

## 2018-05-03 IMAGING — CR DG CHEST 1V PORT
1 series · 1 of 1 positions shown · non-contrast
Comparison: 06/10/2016

CLINICAL DATA: Coronary bypass

EXAM:
PORTABLE CHEST 1 VIEW

[AP]
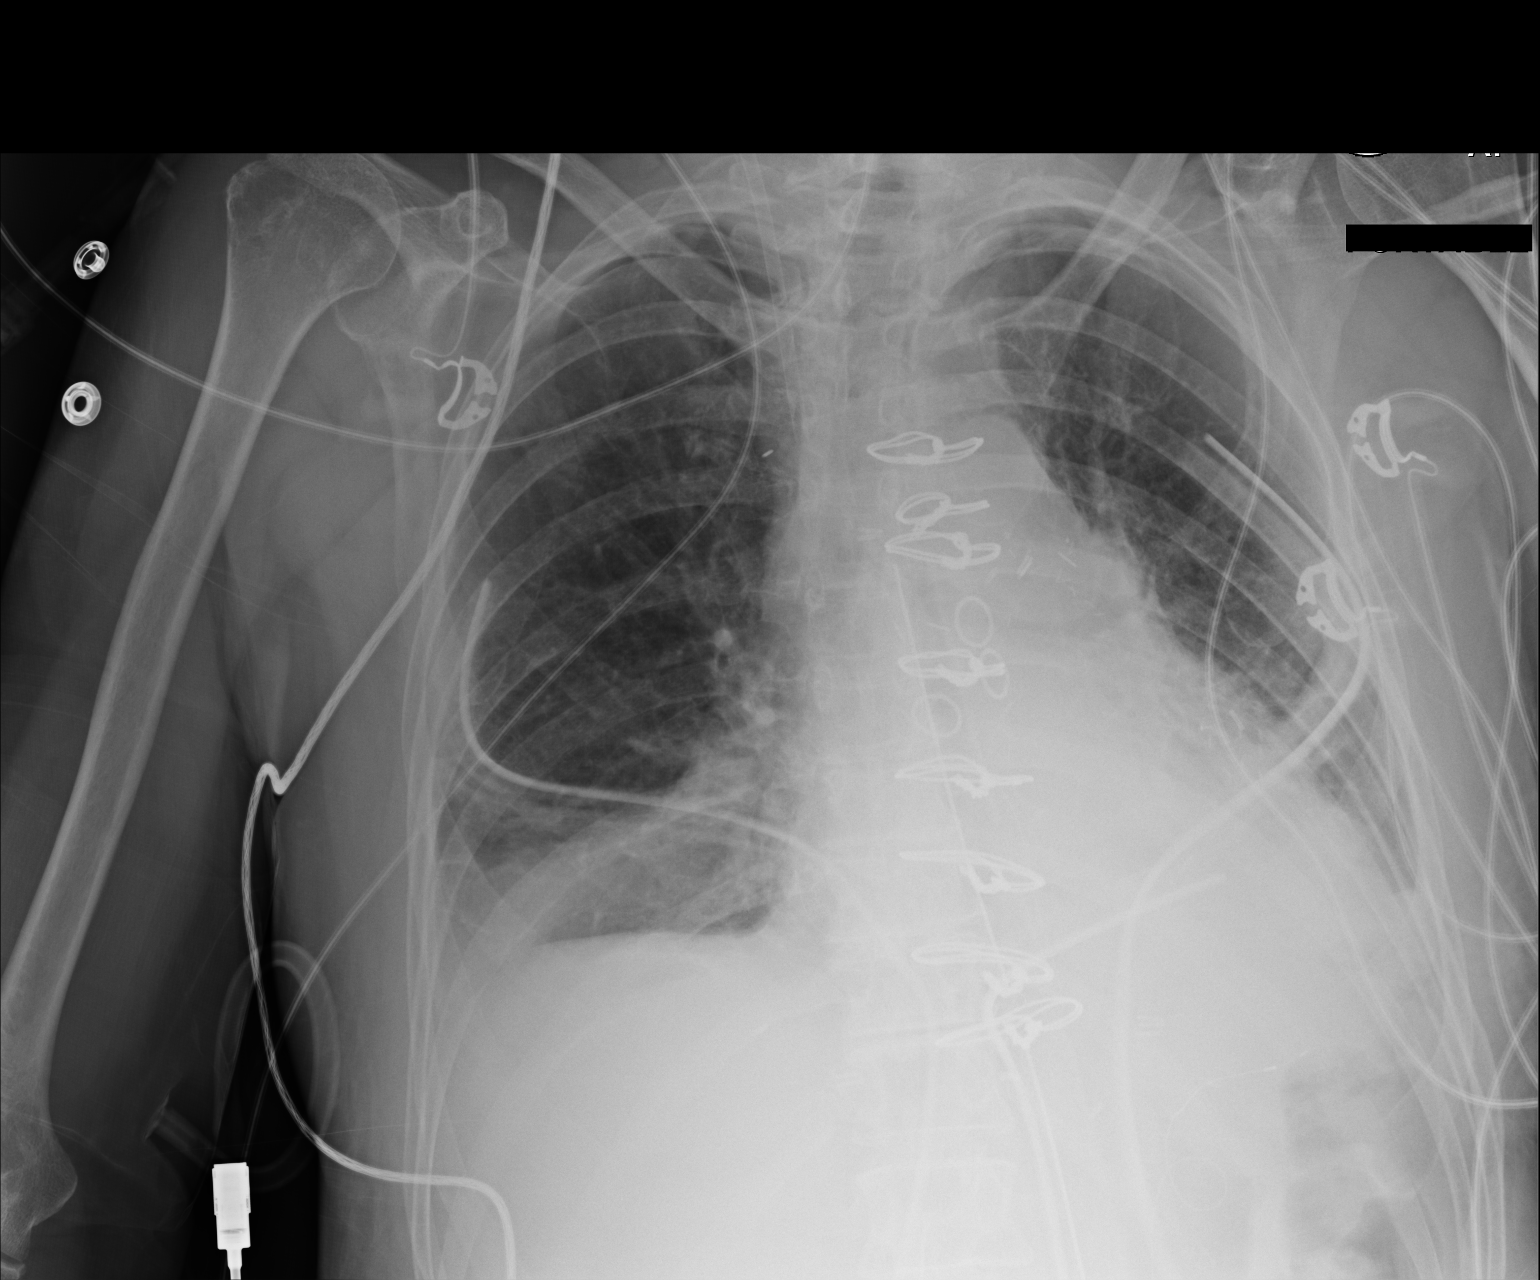

[1 of 1 positions shown; findings below may reference images not displayed]

FINDINGS: Swan-Ganz catheter has been removed. Mediastinal drains, bilateral
chest tubes, and right IJ vascular sheath are stable in position.
Cardiomegaly evident with bibasilar atelectasis/partial collapse,
worse in the left lower lobe. Small pleural effusions noted. Very
small left apical pneumothorax present. Trachea is midline.
IMPRESSION: Persistent bibasilar atelectasis/ partial collapse, worse in the
left lower lobe

Trace pleural effusions

Tiny left apical pneumothorax

## 2018-05-04 IMAGING — DX DG CHEST 2V
2 series · 2 of 2 positions shown · non-contrast
Comparison: One day prior

CLINICAL DATA: Five day postop for CABG. Weakness and chest
discomfort. COPD.

EXAM:
CHEST  2 VIEW

[chest pa]
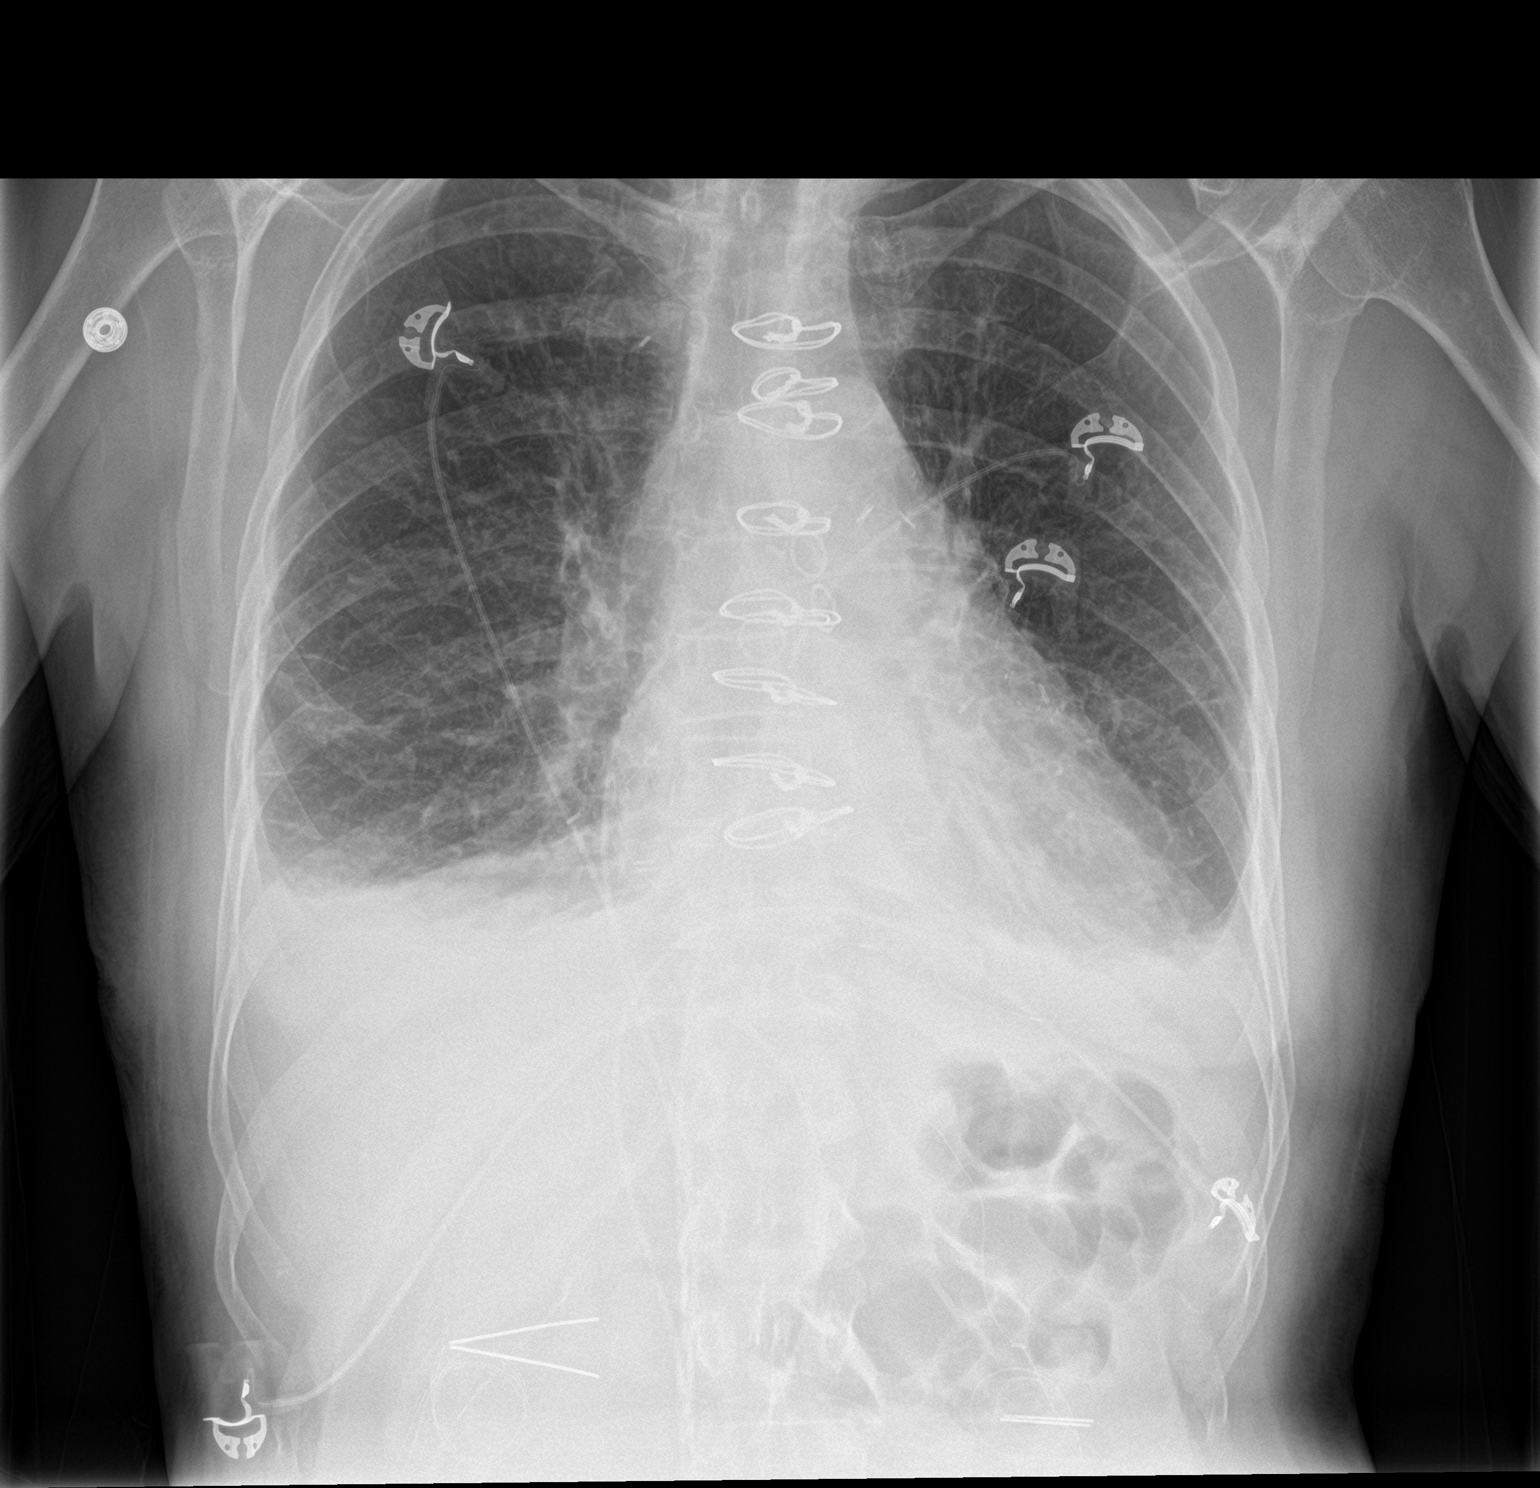

[chest lat]
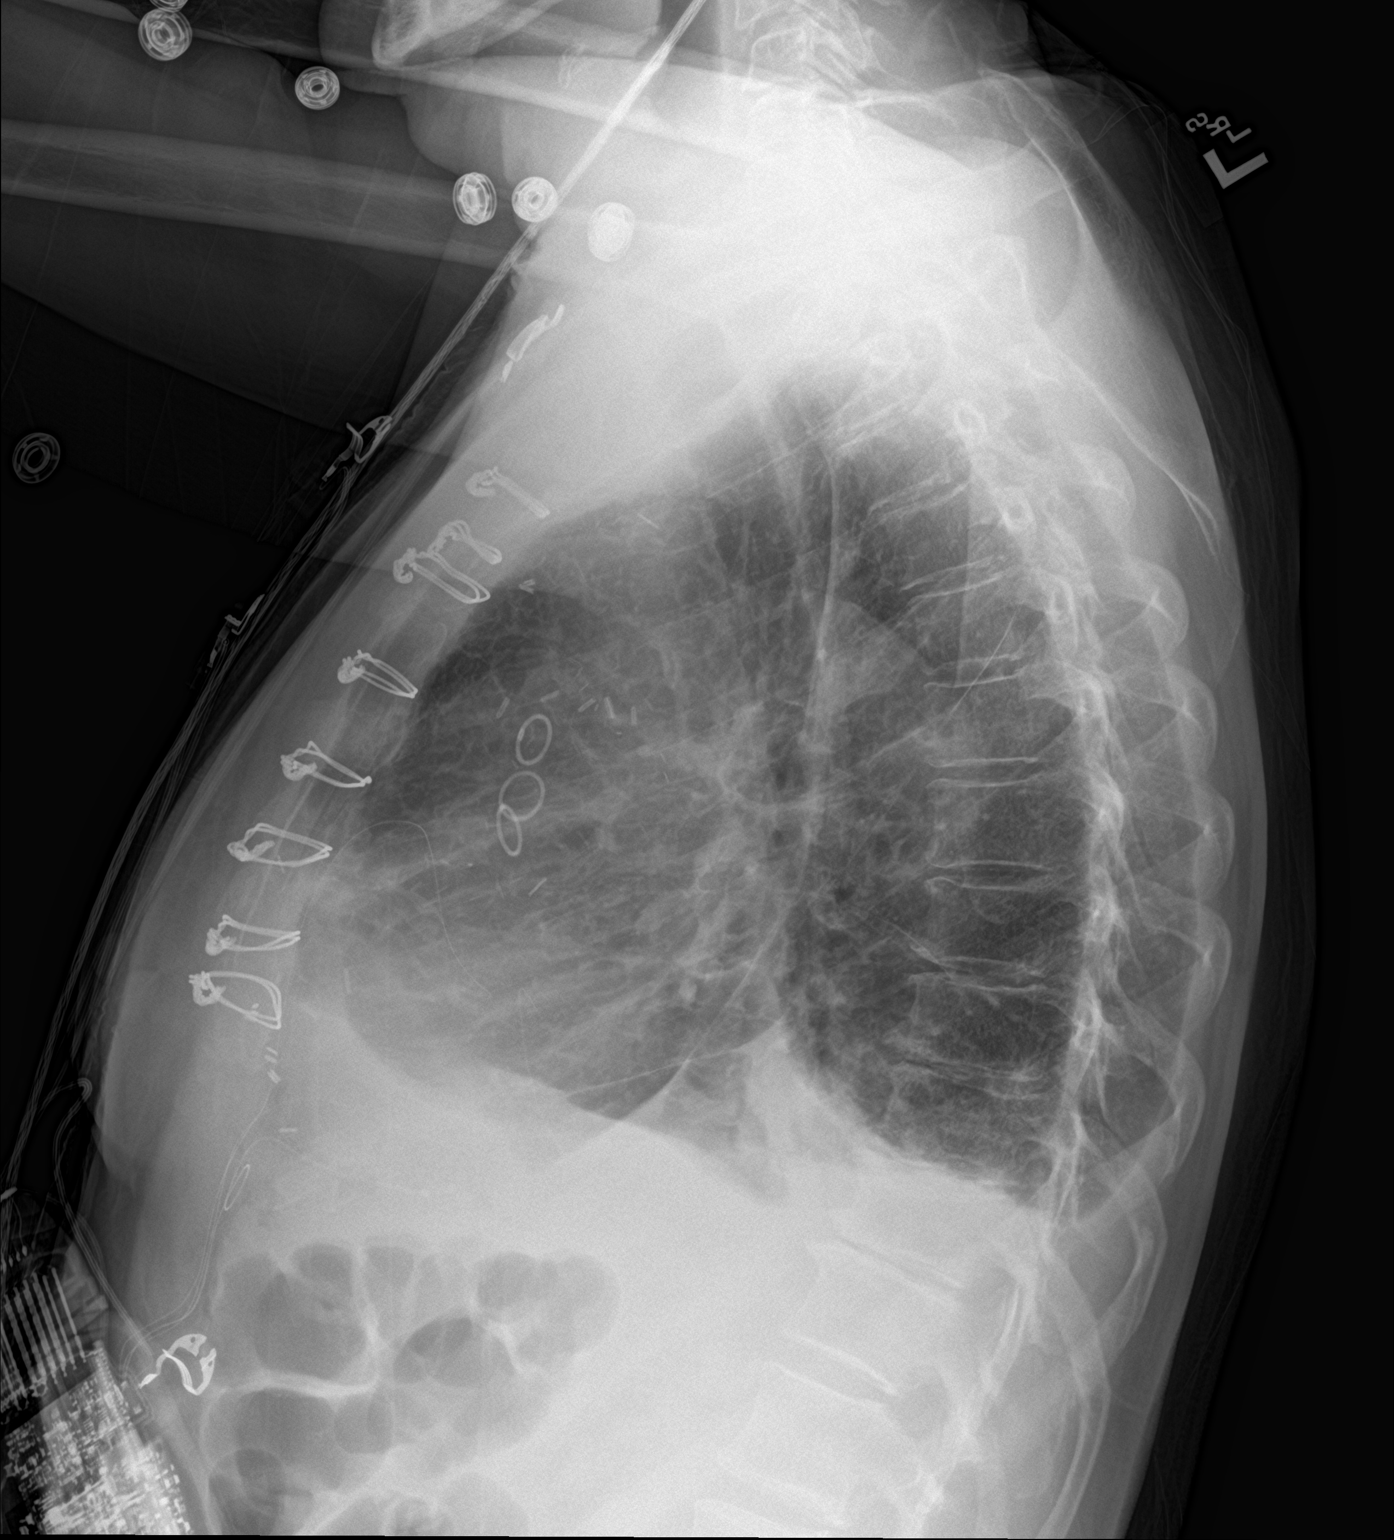

[2 of 2 positions shown; findings below may reference images not displayed]

FINDINGS: Prior median sternotomy. Mild hyperinflation. Midline trachea. Mild
cardiomegaly. Small bilateral pleural effusions remain. Removal of
Cordis sheath, mediastinal drain, and bilateral chest tubes.
Left-sided pneumothorax has resolved. No congestive failure.
Improved left base aeration with persistent bibasilar atelectasis.
IMPRESSION: Removal of support apparatus, without pneumothorax.

Persistent small bilateral pleural effusions and bibasilar
atelectasis. Overall improved aeration.

## 2018-06-27 ENCOUNTER — Telehealth: Payer: Self-pay | Admitting: Cardiology

## 2018-06-27 MED ORDER — METOPROLOL TARTRATE 25 MG PO TABS: 12.5000 mg | ORAL_TABLET | Freq: Two times a day (BID) | ORAL | 3 refills | Status: DC

## 2018-06-27 NOTE — Telephone Encounter (Signed)
New Message     *STAT* If patient is at the pharmacy, call can be transferred to refill team.   1. Which medications need to be refilled? (please list name of each medication and dose if known) Metoprolol tartrate 75mg   2. Which pharmacy/location (including street and city if local pharmacy) is medication to be sent to[facility] [place] [place]  3. Do they need a 30 day or 90 day supply? 90 day supply

## 2018-09-20 ENCOUNTER — Other Ambulatory Visit: Payer: Self-pay | Admitting: Cardiology

## 2018-09-20 NOTE — Telephone Encounter (Signed)
Refilled lopressor

## 2018-12-25 ENCOUNTER — Other Ambulatory Visit: Payer: Self-pay

## 2018-12-25 MED ORDER — LISINOPRIL 20 MG PO TABS: 20.0000 mg | ORAL_TABLET | Freq: Every day | ORAL | 0 refills | Status: DC

## 2018-12-25 NOTE — Telephone Encounter (Signed)
Rx(s) sent to pharmacy electronically.  

## 2019-01-12 ENCOUNTER — Other Ambulatory Visit: Payer: Self-pay | Admitting: Cardiology

## 2019-01-15 ENCOUNTER — Other Ambulatory Visit: Payer: Self-pay | Admitting: Cardiology

## 2019-02-07 ENCOUNTER — Other Ambulatory Visit: Payer: Self-pay | Admitting: Cardiology

## 2019-03-05 ENCOUNTER — Other Ambulatory Visit: Payer: Self-pay | Admitting: Cardiology

## 2019-03-19 ENCOUNTER — Other Ambulatory Visit: Payer: Self-pay | Admitting: Cardiology

## 2019-03-25 ENCOUNTER — Telehealth: Payer: Self-pay | Admitting: Student

## 2019-03-25 NOTE — Telephone Encounter (Signed)
Pt left message requesting refills on :  atorvastatin (LIPITOR) 80 MG tablet [[id number]]  lisinopril (ZESTRIL) 20 MG tablet [[id number]]   Did not give name of pharmacy

## 2019-03-25 NOTE — Telephone Encounter (Signed)
This is a [facility] pt, supposed to see Dr.Crenshaw 1 1/2 years ago, has not been seen. Needs to make apt

## 2019-04-02 ENCOUNTER — Other Ambulatory Visit: Payer: Self-pay | Admitting: Cardiology

## 2019-04-08 ENCOUNTER — Telehealth: Payer: Self-pay | Admitting: Cardiology

## 2019-04-08 NOTE — Telephone Encounter (Signed)
°*  STAT* If patient is at the pharmacy, call can be transferred to refill team.   1. Which medications need to be refilled? (please list name of each medication and dose if known) metoprolol tartrate 25mg   2. Which pharmacy/location (including street and city if local pharmacy) [id number]  3. Do they need a 30 day or 90 day supply? 15

## 2019-04-09 NOTE — Telephone Encounter (Signed)
Reviewed patients chart and noticed patient has not been seen since 2018. Left message for patient to contact our to schedule a follow up appointment before we can authorize more refills. Called Pharmacy and advised them to inform patient to contact our office for additional refills.

## 2019-04-25 ENCOUNTER — Ambulatory Visit (INDEPENDENT_AMBULATORY_CARE_PROVIDER_SITE_OTHER): Payer: Self-pay | Admitting: Cardiology

## 2019-04-25 ENCOUNTER — Encounter: Payer: Self-pay | Admitting: Cardiology

## 2019-04-25 ENCOUNTER — Other Ambulatory Visit: Payer: Self-pay

## 2019-04-25 VITALS — BP 170/82 | HR 80 | Temp 97.4°F | Ht 64.0 in | Wt 132.0 lb

## 2019-04-25 DIAGNOSIS — Z951 Presence of aortocoronary bypass graft: Secondary | ICD-10-CM

## 2019-04-25 DIAGNOSIS — Z720 Tobacco use: Secondary | ICD-10-CM

## 2019-04-25 DIAGNOSIS — E785 Hyperlipidemia, unspecified: Secondary | ICD-10-CM

## 2019-04-25 DIAGNOSIS — I10 Essential (primary) hypertension: Secondary | ICD-10-CM | POA: Insufficient documentation

## 2019-04-25 DIAGNOSIS — R0989 Other specified symptoms and signs involving the circulatory and respiratory systems: Secondary | ICD-10-CM

## 2019-04-25 DIAGNOSIS — J432 Centrilobular emphysema: Secondary | ICD-10-CM

## 2019-04-25 MED ORDER — METOPROLOL TARTRATE 25 MG PO TABS: 25.0000 mg | ORAL_TABLET | Freq: Two times a day (BID) | ORAL | 2 refills | Status: DC

## 2019-04-25 MED ORDER — COMBIVENT RESPIMAT 20-100 MCG/ACT IN AERS: 1.0000 | INHALATION_SPRAY | RESPIRATORY_TRACT | 0 refills | Status: DC | PRN

## 2019-04-25 NOTE — Assessment & Plan Note (Signed)
CABG Dec 2017-LIMA to LAD, free RIMA to Diagonal Branch, SVG to RCA, SVG to OM1

## 2019-04-25 NOTE — Assessment & Plan Note (Signed)
High pitched- check dopplers

## 2019-04-25 NOTE — Progress Notes (Signed)
Cardiology Office Note:    Date:  04/25/2019   ID:  Marie Young, DOB 1956/01/04, MRN [id number]  PCP:  Patient, No Pcp Per  Cardiologist:  Dr Stanford Breed Electrophysiologist:  None   Referring MD: No ref. provider found   No chief complaint on file.   History of Present Illness:    Marie Young is a 63 y.o. female with a hx of a NSTEMI in December 2017. EF was 50-55% by echo.  Catheterization revealed multivessel disease.  She underwent CABG with an LIMA to LAD, free RIMA to diagonal, and SVG to OM1, and an SVG to RCA December 2017.  She saw Dr. Stanford Breed in follow-up in March 2018 and then [place] in November 2018.  We have not seen her since.  She recently contacted the office for medication refills and was told she would need appointment.  Ms. Hamid tells me that she moved to the country in [place] couple years ago.  She does not have a primary care provider as yet.  From a cardiac standpoint she is done well.  She walks daily with her daughter.  She continues to smoke 5 or 6 cigarettes a day.  She has managed to continue her medications although as noted above she needs refills.  She denies any chest pain or unusual shortness of breath.  Past Medical History:  Diagnosis Date  . Anxiety   . Arthritis    "a little in my right shoulder" (06/08/2016)  . COPD (chronic obstructive pulmonary disease) ([place])    pt unaware of this dx on 06/08/2016  . Coronary artery disease involving native coronary artery of native heart with unstable angina pectoris ([place]) 06/08/2016   a. 05/2016: cath showing multivessel CAD with 85% Prox RCA stenosis, 40% Ost LAD, 65% Mid-LAD, 95% 1st Diag, and 99% 2nd Mrg stenosis. b. 06/09/2016: Underwent CABG with LIMA-LAD, Free RIMA-D1, SVG-OM1, and SVG-RCA  . Emphysema lung ([place])   . Headache    "stress" (06/08/2016)  . History of blood transfusion 1984   w/childbirth  . NSTEMI (non-ST elevated myocardial infarction) ([place]) 06/07/2016  .  Situational depression 2011   "going thru divorce"    Past Surgical History:  Procedure Laterality Date  . CARDIAC CATHETERIZATION  06/08/2016  . CARDIAC CATHETERIZATION N/A 06/08/2016   Procedure: Left Heart Cath and Coronary Angiography;  Surgeon: Leonie Man, MD;  Location: [facility];  Service: Cardiovascular;  Laterality: N/A;  . CORNEAL LACERATION REPAIR Right 2016   "poked eye 3-4 years earlier; wore patch for awhile; something started growing on cornea & messed w/my vision so they fixed it"  . CORONARY ARTERY BYPASS GRAFT N/A 06/09/2016   Procedure: CORONARY ARTERY BYPASS GRAFTING x 4 -LIMA to LAD -FREE to DIAGONAL -SVG to OM 1 -SVG to RCA  UTILIZING BILATERAL INTERNAL MAMMARY ARTERY AND ENDOSCOPICALLY HARVESTED RIGHT SAPHENEOUS VEIN;  Surgeon: Rexene Alberts, MD;  Location: [facility];  Service: Open Heart Surgery;  Laterality: N/A;  . DILATION AND CURETTAGE OF UTERUS    . EYE SURGERY    . 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 SHOULDER ARTHROSCOPY WITH ROTATOR CUFF REPAIR Right 03/20/2016   Dr. Onnie Graham  . TEE WITHOUT CARDIOVERSION N/A 06/09/2016   Procedure: TRANSESOPHAGEAL ECHOCARDIOGRAM (TEE);  Surgeon: Rexene Alberts, MD;  Location: [facility];  Service: Open Heart Surgery;  Laterality: N/A;    Current Medications: Current Meds  Medication Sig  . aspirin EC 81 MG EC tablet Take 1 tablet (81 mg total) by mouth daily.  Marland Kitchen atorvastatin (LIPITOR) 80 MG tablet Take 1 tablet (80 mg total) by mouth daily at 6 PM.  . COMBIVENT RESPIMAT 20-100 MCG/ACT AERS respimat Inhale 2 puffs into the lungs 2 (two) times daily.  Marland Kitchen lisinopril (ZESTRIL) 20 MG tablet Take 1 tablet (20 mg total) by mouth daily. *NEEDS OFFICE VISIT-3RD AND FINAL ATTEMPT*  . metoprolol tartrate (LOPRESSOR) 25 MG tablet PLEASE SEE ATTACHED FOR DETAILED DIRECTIONS     Allergies:   Macrobid [nitrofurantoin macrocrystal], Sulfa antibiotics, and Hydrocodone    Social History   Socioeconomic History  . Marital status: Divorced    Spouse name: Not on file  . Number of children: Not on file  . Years of education: Not on file  . Highest education level: Not on file  Occupational History  . Not on file  Social Needs  . Financial resource strain: Not on file  . Food insecurity    Worry: Not on file    Inability: Not on file  . Transportation needs    Medical: Not on file    Non-medical: Not on file  Tobacco Use  . Smoking status: Former Smoker    Packs/day: 0.50    Years: 35.00    Pack years: 17.50    Quit date: 06/09/2016    Years since quitting: 2.8  . Smokeless tobacco: Never Used  Substance and Sexual Activity  . Alcohol use: No  . Drug use: No  . Sexual activity: Yes  Lifestyle  . Physical activity    Days per week: Not on file    Minutes per session: Not on file  . Stress: Not on file  Relationships  . Social Musician on phone: Not on file    Gets together: Not on file    Attends religious service: Not on file    Active member of club or organization: Not on file    Attends meetings of clubs or organizations: Not on file    Relationship status: Not on file  Other Topics Concern  . Not on file  Social History Narrative  . Not on file     Family History: The patient's family history includes Dementia in her mother; Heart disease in her sister; Valvular heart disease in her brother.  ROS:   Please see the history of present illness. The patient has had pain in her wrists and hands from time to time- no swelling or redness.  She denies hip, back, or shoulder discomfort.  She says her brother has "crippling Rheumatoid arthritis"     All other systems reviewed and are negative.  EKGs/Labs/Other Studies Reviewed:    NSR, -HR 78  Recent Labs: No results found for requested labs within last 8760 hours.  Recent Lipid Panel    Component Value Date/Time   CHOL 144 08/25/2016 1021   TRIG 69 08/25/2016  1021   HDL 57 08/25/2016 1021   CHOLHDL 2.5 08/25/2016 1021   VLDL 14 08/25/2016 1021   LDLCALC 73 08/25/2016 1021    Physical Exam:    VS:  BP (!) 170/82   Pulse 80   Temp (!) 97.4 F (36.3 C) (Temporal)   Ht 5\' 4"  (1.626 m)   Wt 132 lb (59.9 kg)   SpO2 95%   BMI 22.66 kg/m     Wt Readings from Last 3 Encounters:  04/25/19 132 lb (59.9  kg)  08/27/17 137 lb (62.1 kg)  05/18/17 137 lb 6.4 oz (62.3 kg)     GEN:  Well nourished, well developed in no acute distress HEENT: Normal NECK: No JVD; high pitched LCA bruit LYMPHATICS: No lymphadenopathy CARDIAC: RRR, no murmurs, rubs, gallops RESPIRATORY:  Clear to auscultation without rales, wheezing or rhonchi  ABDOMEN: Soft, non-tender, non-distended MUSCULOSKELETAL:  No edema; No deformity  SKIN: Warm and dry NEUROLOGIC:  Alert and oriented x 3 PSYCHIATRIC:  Normal affect   ASSESSMENT:    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  Check carotid dopplers.  Increase Lopressor to 25 mg BID and monitor B/P at home.  Virtual follow up in 2-3 weeks.    Medication Adjustments/Labs and Tests Ordered: Current medicines are reviewed at length with the patient today.  Concerns regarding medicines are outlined above.  No orders of the defined types were placed in this encounter.  No orders of the defined types were placed in this encounter.   Patient Instructions  Medication Instructions:  INCREASE Metoprolol to 25mg  Take 1 tablet twice a day *If you need a refill on your cardiac medications before your next appointment, please call your pharmacy*  Lab Work: Your physician recommends that you return for lab work in:  TODAY-LIPID, CMET, CBC,  If you have labs (blood work) drawn today and your tests are completely normal, you will receive your results only by: Marland Kitchen. MyChart Message (if you have MyChart) OR . A paper copy in the mail If you have any lab test that is abnormal or we need to change your treatment, we will call you to review the results.  Testing/Procedures: Your physician has requested that you have a carotid duplex. This test is an ultrasound of the carotid arteries in your neck. It looks at blood flow through these arteries that supply the brain with blood. Allow one hour for this exam. There are no restrictions or special instructions.   Follow-Up: At [facility], you and your health needs are our priority.  As part of our continuing mission to provide you with exceptional heart care, we have created designated Provider Care Teams.  These Care Teams include your primary Cardiologist (physician) and Advanced Practice Providers (APPs -  Physician Assistants and Nurse Practitioners) who all work together to provide you with the care you need, when you need it.  Your next appointment:   FOLLOW UP AFTER TEST  The format for your next appointment:   Virtual Visit   Provider:   Corine ShelterLuke Becci Batty, PA-C  Other Instructions MONITOR YOUR BLOOD PRESSURE CHECK YOUR BLOOD PRESSURE 3 TIMES A WEEK ON MONDAYS IN THE MORNING, WEDNESDAYS AFTER LUNCH AND FRIDAYS IN THE EVENING. A BLOOD PRESSURE CUFF WILL BE MAILED TO YOU    Signed, Corine ShelterLuke Yoltzin Ransom, PA-C  04/25/2019 12:12 PM    [facility]

## 2019-04-25 NOTE — Assessment & Plan Note (Signed)
Previously on high dose statin

## 2019-04-25 NOTE — Assessment & Plan Note (Signed)
Repeat B/P by me 152/68 

## 2019-04-25 NOTE — Patient Instructions (Addendum)
Medication Instructions:  INCREASE Metoprolol to 25mg  Take 1 tablet twice a day *If you need a refill on your cardiac medications before your next appointment, please call your pharmacy*  Lab Work: Your physician recommends that you return for lab work in: TODAY-LIPID, CMET, CBC  If you have labs (blood work) drawn today and your tests are completely normal, you will receive your results only by: Marland Kitchen MyChart Message (if you have MyChart) OR . A paper copy in the mail If you have any lab test that is abnormal or we need to change your treatment, we will call you to review the results.  Testing/Procedures: Your physician has requested that you have a carotid duplex. This test is an ultrasound of the carotid arteries in your neck. It looks at blood flow through these arteries that supply the brain with blood. Allow one hour for this exam. There are no restrictions or special instructions. PLEASE SCHEDULE IN THE [place] OFFICE SINCE PATIENT LIVES IN [place]  Follow-Up: At [facility], you and your health needs are our priority.  As part of our continuing mission to provide you with exceptional heart care, we have created designated Provider Care Teams.  These Care Teams include your primary Cardiologist (physician) and Advanced Practice Providers (APPs -  Physician Assistants and Nurse Practitioners) who all work together to provide you with the care you need, when you need it.  Your next appointment:   FOLLOW UP AFTER TEST  The format for your next appointment:   Virtual Visit   Provider:   Kerin Ransom, PA-C  Other Instructions MONITOR YOUR BLOOD PRESSURE CHECK YOUR BLOOD PRESSURE 3 TIMES A WEEK ON MONDAYS IN THE MORNING, WEDNESDAYS AFTER LUNCH AND FRIDAYS IN THE EVENING. A BLOOD PRESSURE CUFF WILL BE MAILED TO YOU SALTY SIX HANDOUT GIVEN

## 2019-04-25 NOTE — Assessment & Plan Note (Signed)
"  Mild" on chest CTA 2017

## 2019-04-26 LAB — COMPREHENSIVE METABOLIC PANEL
ALT: 18 IU/L (ref 0–32)
AST: 19 IU/L (ref 0–40)
Albumin/Globulin Ratio: 1.4 (ref 1.2–2.2)
Albumin: 4.5 g/dL (ref 3.8–4.8)
Alkaline Phosphatase: 138 IU/L — ABNORMAL HIGH (ref 39–117)
BUN/Creatinine Ratio: 26 (ref 12–28)
BUN: 16 mg/dL (ref 8–27)
Bilirubin Total: 0.3 mg/dL (ref 0.0–1.2)
CO2: 22 mmol/L (ref 20–29)
Calcium: 9.5 mg/dL (ref 8.7–10.3)
Chloride: 103 mmol/L (ref 96–106)
Creatinine, Ser: 0.61 mg/dL (ref 0.57–1.00)
GFR calc Af Amer: 112 mL/min/{1.73_m2} (ref >59)
GFR calc non Af Amer: 97 mL/min/{1.73_m2} (ref >59)
Globulin, Total: 3.2 g/dL (ref 1.5–4.5)
Glucose: 108 mg/dL — ABNORMAL HIGH (ref 65–99)
Potassium: 5 mmol/L (ref 3.5–5.2)
Sodium: 140 mmol/L (ref 134–144)
Total Protein: 7.7 g/dL (ref 6.0–8.5)

## 2019-04-26 LAB — CBC WITH DIFFERENTIAL/PLATELET
Basophils Absolute: 0.1 10*3/uL (ref 0.0–0.2)
Basos: 1 %
EOS (ABSOLUTE): 0.6 10*3/uL — ABNORMAL HIGH (ref 0.0–0.4)
Eos: 4 %
Hematocrit: 40.4 % (ref 34.0–46.6)
Hemoglobin: 13.8 g/dL (ref 11.1–15.9)
Immature Grans (Abs): 0 10*3/uL (ref 0.0–0.1)
Immature Granulocytes: 0 %
Lymphocytes Absolute: 4.6 10*3/uL — ABNORMAL HIGH (ref 0.7–3.1)
Lymphs: 34 %
MCH: 31.4 pg (ref 26.6–33.0)
MCHC: 34.2 g/dL (ref 31.5–35.7)
MCV: 92 fL (ref 79–97)
Monocytes Absolute: 1 10*3/uL — ABNORMAL HIGH (ref 0.1–0.9)
Monocytes: 7 %
Neutrophils Absolute: 7.5 10*3/uL — ABNORMAL HIGH (ref 1.4–7.0)
Neutrophils: 54 %
Platelets: 434 10*3/uL (ref 150–450)
RBC: 4.39 x10E6/uL (ref 3.77–5.28)
RDW: 13 % (ref 11.7–15.4)
WBC: 13.8 10*3/uL — ABNORMAL HIGH (ref 3.4–10.8)

## 2019-04-26 LAB — LIPID PANEL
Chol/HDL Ratio: 2.1 ratio (ref 0.0–4.4)
Cholesterol, Total: 128 mg/dL (ref 100–199)
HDL: 60 mg/dL (ref >39)
LDL Chol Calc (NIH): 55 mg/dL (ref 0–99)
Triglycerides: 61 mg/dL (ref 0–149)
VLDL Cholesterol Cal: 13 mg/dL (ref 5–40)

## 2019-04-29 ENCOUNTER — Telehealth: Payer: Self-pay

## 2019-04-29 ENCOUNTER — Telehealth: Payer: Self-pay | Admitting: Licensed Clinical Social Worker

## 2019-04-29 MED ORDER — ATORVASTATIN CALCIUM 80 MG PO TABS: 80.0000 mg | ORAL_TABLET | Freq: Every day | ORAL | 0 refills | Status: DC

## 2019-04-29 NOTE — Telephone Encounter (Signed)
CSW referred to assist patient with obtaining a BP cuff. CSW contacted patient to inform cuff will be delivered to home. Message left. CSW available as needed. Jackie Kacyn Souder, LCSW, CCSW-MCS [phone]  

## 2019-04-29 NOTE — Telephone Encounter (Signed)
Requested Prescriptions   Signed Prescriptions Disp Refills  . atorvastatin (LIPITOR) 80 MG tablet 90 tablet 0    Sig: Take 1 tablet (80 mg total) by mouth daily at 6 PM.    Authorizing Provider: Erlene Quan    Ordering User: Raelene Bott, BRANDY L

## 2019-04-29 NOTE — Telephone Encounter (Signed)
New message     *STAT* If patient is at the pharmacy, call can be transferred to refill team.   1. Which medications need to be refilled? (please list name of each medication and dose if known)  atorvastatin (LIPITOR) 80 MG tablet Take 1 tablet (80 mg total) by mouth daily at 6 PM.     2. Which pharmacy/location (including street and city if local pharmacy) is [facility] [employer]   3. Do they need a 30 day or 90 day supply? [place]

## 2019-04-30 ENCOUNTER — Other Ambulatory Visit: Payer: Self-pay

## 2019-04-30 ENCOUNTER — Ambulatory Visit (INDEPENDENT_AMBULATORY_CARE_PROVIDER_SITE_OTHER): Payer: Self-pay

## 2019-04-30 ENCOUNTER — Other Ambulatory Visit: Payer: Self-pay | Admitting: Cardiology

## 2019-04-30 DIAGNOSIS — R0989 Other specified symptoms and signs involving the circulatory and respiratory systems: Secondary | ICD-10-CM

## 2019-05-01 ENCOUNTER — Telehealth: Payer: Self-pay | Admitting: *Deleted

## 2019-05-01 ENCOUNTER — Telehealth: Payer: Self-pay | Admitting: Cardiology

## 2019-05-01 DIAGNOSIS — I6523 Occlusion and stenosis of bilateral carotid arteries: Secondary | ICD-10-CM

## 2019-05-01 NOTE — Telephone Encounter (Signed)
I called patient to discuss her carotid doppler results and Dr Jacalyn Lefevre recommendations -left message to call back.   Kerin Ransom PA-C 05/01/2019 1:26 PM

## 2019-05-01 NOTE — Telephone Encounter (Addendum)
Left message for pt to call    ----- Message from Lelon Perla, MD sent at 04/30/2019  4:25 PM EST ----- Please schedule appt with vascular surgery Kirk Ruths

## 2019-05-01 NOTE — Telephone Encounter (Signed)
I have tried calling her back 3 times- no answer. I'm off tomorrow but will try her again.  Kerin Ransom PA-C 05/01/2019 4:54 PM

## 2019-05-01 NOTE — Telephone Encounter (Signed)
Follow up  ° ° °Pt is returning call  ° ° °Please call back  °

## 2019-05-01 NOTE — Telephone Encounter (Signed)
Follow up    Pt is returning your call    Please call back

## 2019-05-01 NOTE — Telephone Encounter (Signed)
Spoke with pt, Aware of dr crenshaw's recommendations. Referral placed. ° °

## 2019-05-03 ENCOUNTER — Telehealth: Payer: Self-pay | Admitting: Cardiology

## 2019-05-03 NOTE — Telephone Encounter (Signed)
Third phone call - I left a message that we will be referring her to a vascular surgeon and to call back Monday if she has questions.   Kerin Ransom PA-C 05/03/2019 10:44 AM

## 2019-05-05 ENCOUNTER — Encounter: Payer: Self-pay | Admitting: Cardiology

## 2019-05-05 ENCOUNTER — Telehealth: Payer: Self-pay

## 2019-05-05 ENCOUNTER — Telehealth (INDEPENDENT_AMBULATORY_CARE_PROVIDER_SITE_OTHER): Payer: Self-pay | Admitting: Cardiology

## 2019-05-05 VITALS — BP 126/72 | HR 92 | Ht 64.0 in | Wt 132.0 lb

## 2019-05-05 DIAGNOSIS — E785 Hyperlipidemia, unspecified: Secondary | ICD-10-CM

## 2019-05-05 DIAGNOSIS — I10 Essential (primary) hypertension: Secondary | ICD-10-CM

## 2019-05-05 DIAGNOSIS — Z951 Presence of aortocoronary bypass graft: Secondary | ICD-10-CM

## 2019-05-05 DIAGNOSIS — I6522 Occlusion and stenosis of left carotid artery: Secondary | ICD-10-CM

## 2019-05-05 DIAGNOSIS — J432 Centrilobular emphysema: Secondary | ICD-10-CM

## 2019-05-05 NOTE — Telephone Encounter (Addendum)
Patient called back and gave me a number to a Vascular office closer to her in [place]. [place].  Phone [phone] Fax [phone].   I contact the office and spoke with receptionist. She stated patient has already contacted the office and scheduled an appointment on Novemberr 24, 2020 a 11 am. She asked of I could send over records.   Records fax. Will contact office later to make sure records are received.

## 2019-05-05 NOTE — Patient Instructions (Signed)
Medication Instructions:  Your physician recommends that you continue on your current medications as directed. Please refer to the Current Medication list given to you today. *If you need a refill on your cardiac medications before your next appointment, please call your pharmacy*  Lab Work: NONE  If you have labs (blood work) drawn today and your tests are completely normal, you will receive your results only by: Marland Kitchen MyChart Message (if you have MyChart) OR . A paper copy in the mail If you have any lab test that is abnormal or we need to change your treatment, we will call you to review the results.  Testing/Procedures: NONE   Follow-Up: At [facility], you and your health needs are our priority.  As part of our continuing mission to provide you with exceptional heart care, we have created designated Provider Care Teams.  These Care Teams include your primary Cardiologist (physician) and Advanced Practice Providers (APPs -  Physician Assistants and Nurse Practitioners) who all work together to provide you with the care you need, when you need it.  Your next appointment:   4 WEEKS   The format for your next appointment:   Virtual Visit   Provider:   Kerin Ransom, PA-C  Other Instructions REFERRAL [place]

## 2019-05-05 NOTE — Progress Notes (Signed)
Virtual Visit via Telephone Note   This visit type was conducted due to national recommendations for restrictions regarding the COVID-19 Pandemic (e.g. social distancing) in an effort to limit this patient's exposure and mitigate transmission in our community.  Due to her co-morbid illnesses, this patient is at least at moderate risk for complications without adequate follow up.  This format is felt to be most appropriate for this patient at this time.  The patient did not have access to video technology/had technical difficulties with video requiring transitioning to audio format only (telephone).  All issues noted in this document were discussed and addressed.  No physical exam could be performed with this format.  Please refer to the patient's chart for her  consent to telehealth for [facility].   Date:  05/05/2019   ID:  Marie AzureSandra K Dante, DOB 01/12/1956, MRN [id number]  Patient Location: Home Provider Location: Home  PCP:  Patient, No Pcp Per  Cardiologist:  Dr Jens Somrenshaw Electrophysiologist:  None   Evaluation Performed:  Follow-Up Visit  Chief Complaint:  F/U carotid doppler study  History of Present Illness:    Marie Young is a pleasant 63 y.o. female with a hx of a NSTEMI in December 2017. EF then was 50-55% by echo.  Catheterization revealed multivessel disease and she underwent CABG x 4 with an LIMA to LAD, free RIMA to diagonal, and SVG to OM1, and an SVG to RCA December 2017.  She saw Dr. Jens Somrenshaw in follow-up in March 2018 and then [place] 2018.   I saw her in the office 04/25/2019, she was told by her pharmacist that she would need an office visit before they refilled her prescriptions.  Since we saw her last in 2018 the patient has moved to [place], she lives in a fairly isolated area.  From a cardiac standpoint she has been doing well.  She walks daily with her daughter.  She does continue to smoke 5 to 6 cigarettes a day.  In the office she  had a high-pitched left carotid bruit.  Doppler studies were ordered.  These revealed an 80 to 99% left internal carotid artery stenosis and 40 to 59% right internal carotid artery stenosis.  We have had some issues contacting the patient because of her poor cell phone service.  She was contacted today and I spoke with the patient and her daughter who was also on the phone.  I explained that her doppler study was reviewed with Dr. Jens Somrenshaw and he would like the patient referred to a vascular surgeon.  The patient understands, as of yet she has no primary care or medical contacts in [place] and would like to come to [place].  I suggested she continue to take he aspirin 81 mg, lisinopril, metoprolol, and her statin though she has not yet filled her statin prescription.  The patient does not have symptoms concerning for COVID-19 infection (fever, chills, cough, or new shortness of breath).    Past Medical History:  Diagnosis Date  . Anxiety   . Arthritis    "a little in my right shoulder" (06/08/2016)  . COPD (chronic obstructive pulmonary disease) (HCC)    pt unaware of this dx on 06/08/2016  . 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 05/2016: cath showing multivessel CAD with 85% Prox RCA stenosis, 40% Ost LAD, 65% Mid-LAD, 95% 1st Diag, and 99% 2nd Mrg stenosis. b. 06/09/2016: Underwent CABG  with LIMA-LAD, Free RIMA-D1, SVG-OM1, and SVG-RCA  . Emphysema lung ([place])   . Headache    "stress" (06/08/2016)  . History of blood transfusion 1984   w/childbirth  . NSTEMI (non-ST elevated myocardial infarction) ([place]) 06/07/2016  . Situational depression 2011   "going thru divorce"   Past Surgical History:  Procedure Laterality Date  . CARDIAC CATHETERIZATION  06/08/2016  . CARDIAC CATHETERIZATION N/A 06/08/2016   Procedure: Left Heart Cath and Coronary Angiography;  Surgeon: Leonie Man, MD;  Location: [place] [facility];  Service: Cardiovascular;  Laterality: N/A;  . CORNEAL LACERATION REPAIR Right 2016   "poked eye 3-4 years earlier; wore patch for awhile; something started growing on cornea & messed w/my vision so they fixed it"  . CORONARY ARTERY BYPASS GRAFT N/A 06/09/2016   Procedure: CORONARY ARTERY BYPASS GRAFTING x 4 -LIMA to LAD -FREE to DIAGONAL -SVG to OM 1 -SVG to RCA  UTILIZING BILATERAL INTERNAL MAMMARY ARTERY AND ENDOSCOPICALLY HARVESTED RIGHT SAPHENEOUS VEIN;  Surgeon: Rexene Alberts, MD;  Location: [place];  Service: Open Heart Surgery;  Laterality: N/A;  . DILATION AND CURETTAGE OF UTERUS    . EYE SURGERY    . 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 SHOULDER ARTHROSCOPY WITH ROTATOR CUFF REPAIR Right 03/20/2016   Dr. Onnie Graham  . TEE WITHOUT CARDIOVERSION N/A 06/09/2016   Procedure: TRANSESOPHAGEAL ECHOCARDIOGRAM (TEE);  Surgeon: Rexene Alberts, MD;  Location: [place];  Service: Open Heart Surgery;  Laterality: N/A;     Current Meds  Medication Sig  . aspirin EC 81 MG EC tablet Take 1 tablet (81 mg total) by mouth daily.  Marland Kitchen atorvastatin (LIPITOR) 80 MG tablet Take 1 tablet (80 mg total) by mouth daily at 6 PM.  . Collagen-Boron-Hyaluronic Acid ([employer] JOINT HEALTH TRIPLE ACTION PO) Take 1 tablet by mouth daily.  Marland Kitchen lisinopril (ZESTRIL) 20 MG tablet Take 1 tablet (20 mg total) by mouth daily. *NEEDS OFFICE VISIT-3RD AND FINAL ATTEMPT*  . metoprolol tartrate (LOPRESSOR) 25 MG tablet Take 1 tablet (25 mg total) by mouth 2 (two) times daily.  . Multiple Vitamins-Minerals (CENTRUM ADULTS PO) Take 1 tablet by mouth daily.  . NON FORMULARY as needed.     Allergies:   Macrobid [nitrofurantoin macrocrystal], Sulfa antibiotics, and Hydrocodone   Social History   Tobacco Use  . Smoking status: Former Smoker    Packs/day: 0.50    Years: 35.00    Pack years: 17.50    Quit date: 06/09/2016    Years since quitting: 2.9  .  Smokeless tobacco: Never Used  Substance Use Topics  . Alcohol use: No  . Drug use: No     Family Hx: The patient's family history includes Dementia in her mother; Heart disease in her sister; Valvular heart disease in her brother.  ROS:   Please see the history of present illness.    No history of stroke or TIA All other systems reviewed and are negative.   Prior CV studies:   The following studies were reviewed today:  Carotid dopplers 04/30/2019  Labs/Other Tests and Data Reviewed:    EKG:  No ECG reviewed.  Recent Labs: 04/25/2019: ALT 18; BUN 16; Creatinine, Ser 0.61; Hemoglobin 13.8; Platelets 434; Potassium 5.0; Sodium 140   Recent Lipid Panel Lab Results  Component Value Date/Time   CHOL 128 04/25/2019 12:29 PM   TRIG 61 04/25/2019 12:29 PM  HDL 60 04/25/2019 12:29 PM   CHOLHDL 2.1 04/25/2019 12:29 PM   CHOLHDL 2.5 08/25/2016 10:21 AM   LDLCALC 55 04/25/2019 12:29 PM    Wt Readings from Last 3 Encounters:  05/05/19 132 lb (59.9 kg)  04/25/19 132 lb (59.9 kg)  08/27/17 137 lb (62.1 kg)     Objective:    Vital Signs:  BP 126/72   Pulse 92   Ht 5\' 4"  (1.626 m)   Wt 132 lb (59.9 kg)   BMI 22.66 kg/m    VITAL SIGNS:  reviewed  ASSESSMENT & PLAN:    Carotid stenosis- Asymptomatic 80-99% LICA stenosis  S/P CABG x 4 CABG Dec 2017-LIMA to LAD, free RIMA to Diagonal Branch, SVG to RCA, SVG to OM1  Centrilobular emphysema (HCC) "Mild" on chest CTA 2017  Hyperlipidemia LDL goal <70 Previously on high dose statin- to be resumed  Essential hypertension B/P stable    COVID-19 Education: The signs and symptoms of COVID-19 were discussed with the patient and how to seek care for testing (follow up with PCP or arrange E-visit).  The importance of social distancing was discussed today.  Time:   Today, I have spent 20 minutes with the patient with telehealth technology discussing the above problems.     Medication Adjustments/Labs and Tests  Ordered: Current medicines are reviewed at length with the patient today.  Concerns regarding medicines are outlined above.   Tests Ordered: No orders of the defined types were placed in this encounter.   Medication Changes: No orders of the defined types were placed in this encounter.   Follow Up:  In Person - referal to vascular surgeon ASAP.  It should be noted that the patient has poor cell phone service and requests scheduling information be left as a message on her cell phone.   Signed, 2018, PA-C  05/05/2019 10:28 AM    [facility]

## 2019-05-05 NOTE — Telephone Encounter (Signed)
Contacted patient to discuss AVS Instructions. Gave patient Luke's recommendations from today's virtual office visit. Informed patient that someone from Vascular and vein dept will be in contact with them to schedule her new patient appt. Advised patient to research and see if there are any providers in her area since she recently located to [place] that could get her seen sooner, since I'm not sure how long the wait is for the VVS clinic in [place]. Patient voiced understanding and AVS mailed.

## 2019-05-05 NOTE — Telephone Encounter (Signed)
Virtual Visit Pre-Appointment Phone Call  "(Name), I am calling you today to discuss your upcoming appointment. We are currently trying to limit exposure to the virus that causes COVID-19 by seeing patients at home rather than in the office."  1. "What is the BEST phone number to call the day of the visit?" - include this in appointment notes  2. "Do you have or have access to (through a family member/friend) a smartphone with video capability that we can use for your visit?" a. If yes - list this number in appt notes as "cell" (if different from BEST phone #) and list the appointment type as a VIDEO visit in appointment notes b. If no - list the appointment type as a PHONE visit in appointment notes  3. Confirm consent - "In the setting of the current Covid19 crisis, you are scheduled for a (phone or video) visit with your provider on (date) at (time).  Just as we do with many in-office visits, in order for you to participate in this visit, we must obtain consent.  If you'd like, I can send this to your mychart (if signed up) or email for you to review.  Otherwise, I can obtain your verbal consent now.  All virtual visits are billed to your insurance company just like a normal visit would be.  By agreeing to a virtual visit, we'd like you to understand that the technology does not allow for your provider to perform an examination, and thus may limit your provider's ability to fully assess your condition. If your provider identifies any concerns that need to be evaluated in person, we will make arrangements to do so.  Finally, though the technology is pretty good, we cannot assure that it will always work on either your or our end, and in the setting of a video visit, we may have to convert it to a phone-only visit.  In either situation, we cannot ensure that we have a secure connection.  Are you willing to proceed?" STAFF: Did the patient verbally acknowledge consent to telehealth visit? Document  YES/NO here: YES  4. Advise patient to be prepared - "Two hours prior to your appointment, go ahead and check your blood pressure, pulse, oxygen saturation, and your weight (if you have the equipment to check those) and write them all down. When your visit starts, your provider will ask you for this information. If you have an Apple Watch or Kardia device, please plan to have heart rate information ready on the day of your appointment. Please have a pen and paper handy nearby the day of the visit as well."  5. Give patient instructions for MyChart download to smartphone OR Doximity/Doxy.me as below if video visit (depending on what platform provider is using)  6. Inform patient they will receive a phone call 15 minutes prior to their appointment time (may be from unknown caller ID) so they should be prepared to answer    TELEPHONE CALL NOTE  Marie Young has been deemed a candidate for a follow-up tele-health visit to limit community exposure during the Covid-19 pandemic. I spoke with the patient via phone to ensure availability of phone/video source, confirm preferred email & phone number, and discuss instructions and expectations.  I reminded Marie Young to be prepared with any vital sign and/or heart rhythm information that could potentially be obtained via home monitoring, at the time of her visit. I reminded Marie Young to expect a phone call prior to  her visit.  Lucita Ferrara, CMA 05/05/2019 9:43 AM   INSTRUCTIONS FOR DOWNLOADING THE MYCHART APP TO SMARTPHONE  - The patient must first make sure to have activated MyChart and know their login information - If Apple, go to [employer] and type in MyChart in the search bar and download the app. If Android, ask patient to go to [employer] and type in [place] in the search bar and download the app. The app is free but as with any other app downloads, their phone may require them to verify saved payment information or  Apple/Android password.  - The patient will need to then log into the app with their MyChart username and password, and select [facility] as their healthcare provider to link the account. When it is time for your visit, go to the MyChart app, find appointments, and click Begin Video Visit. Be sure to Select Allow for your device to access the Microphone and Camera for your visit. You will then be connected, and your provider will be with you shortly.  **If they have any issues connecting, or need assistance please contact MyChart service desk [phone])**  **If using a computer, in order to ensure the best quality for their visit they will need to use either of the following Internet Browsers: [employer] or Google Chrome**  IF USING DOXIMITY or DOXY.ME - The patient will receive a link just prior to their visit by text.     FULL LENGTH CONSENT FOR TELE-HEALTH VISIT   I hereby voluntarily request, consent and authorize [facility] and its employed or contracted physicians, physician assistants, nurse practitioners or other licensed health care professionals (the Practitioner), to provide me with telemedicine health care services (the "Services") as deemed necessary by the treating Practitioner. I acknowledge and consent to receive the Services by the Practitioner via telemedicine. I understand that the telemedicine visit will involve communicating with the Practitioner through live audiovisual communication technology and the disclosure of certain medical information by electronic transmission. I acknowledge that I have been given the opportunity to request an in-person assessment or other available alternative prior to the telemedicine visit and am voluntarily participating in the telemedicine visit.  I understand that I have the right to withhold or withdraw my consent to the use of telemedicine in the course of my care at any time, without affecting my right to future care  or treatment, and that the Practitioner or I may terminate the telemedicine visit at any time. I understand that I have the right to inspect all information obtained and/or recorded in the course of the telemedicine visit and may receive copies of available information for a reasonable fee.  I understand that some of the potential risks of receiving the Services via telemedicine include:  Marland Kitchen Delay or interruption in medical evaluation due to technological equipment failure or disruption; . Information transmitted may not be sufficient (e.g. poor resolution of images) to allow for appropriate medical decision making by the Practitioner; and/or  . In rare instances, security protocols could fail, causing a breach of personal health information.  Furthermore, I acknowledge that it is my responsibility to provide information about my medical history, conditions and care that is complete and accurate to the best of my ability. I acknowledge that Practitioner's advice, recommendations, and/or decision may be based on factors not within their control, such as incomplete or inaccurate data provided by me or distortions of diagnostic images or specimens that may result from electronic transmissions.  I understand that the practice of medicine is not an exact science and that Practitioner makes no warranties or guarantees regarding treatment outcomes. I acknowledge that I will receive a copy of this consent concurrently upon execution via email to the email address I last provided but may also request a printed copy by calling the office of [facility].    I understand that my insurance will be billed for this visit.   I have read or had this consent read to me. . I understand the contents of this consent, which adequately explains the benefits and risks of the Services being provided via telemedicine.  . I have been provided ample opportunity to ask questions regarding this consent and the Services and have had  my questions answered to my satisfaction. . I give my informed consent for the services to be provided through the use of telemedicine in my medical care  By participating in this telemedicine visit I agree to the above.

## 2019-05-06 NOTE — Telephone Encounter (Signed)
Called the office and the receptionist stated the records were received.

## 2019-05-22 ENCOUNTER — Telehealth: Payer: Self-pay

## 2019-05-22 ENCOUNTER — Telehealth (INDEPENDENT_AMBULATORY_CARE_PROVIDER_SITE_OTHER): Payer: Self-pay | Admitting: Cardiology

## 2019-05-22 ENCOUNTER — Encounter: Payer: Self-pay | Admitting: Cardiology

## 2019-05-22 VITALS — BP 135/87 | HR 87 | Ht 64.0 in | Wt 134.0 lb

## 2019-05-22 DIAGNOSIS — Z951 Presence of aortocoronary bypass graft: Secondary | ICD-10-CM

## 2019-05-22 DIAGNOSIS — I6522 Occlusion and stenosis of left carotid artery: Secondary | ICD-10-CM

## 2019-05-22 DIAGNOSIS — E785 Hyperlipidemia, unspecified: Secondary | ICD-10-CM

## 2019-05-22 DIAGNOSIS — I10 Essential (primary) hypertension: Secondary | ICD-10-CM

## 2019-05-22 DIAGNOSIS — J432 Centrilobular emphysema: Secondary | ICD-10-CM

## 2019-05-22 NOTE — Patient Instructions (Signed)
Medication Instructions:  Your physician recommends that you continue on your current medications as directed. Please refer to the Current Medication list given to you today. *If you need a refill on your cardiac medications before your next appointment, please call your pharmacy*  Lab Work: None  If you have labs (blood work) drawn today and your tests are completely normal, you will receive your results only by: . MyChart Message (if you have MyChart) OR . A paper copy in the mail If you have any lab test that is abnormal or we need to change your treatment, we will call you to review the results.  Testing/Procedures: None   Follow-Up: At [facility], you and your health needs are our priority.  As part of our continuing mission to provide you with exceptional heart care, we have created designated Provider Care Teams.  These Care Teams include your primary Cardiologist (physician) and Advanced Practice Providers (APPs -  Physician Assistants and Nurse Practitioners) who all work together to provide you with the care you need, when you need it.  Your next appointment:   5 months  The format for your next appointment:   In Person  Provider:   Brian Crenshaw, MD  Other Instructions  

## 2019-05-22 NOTE — Progress Notes (Signed)
Virtual Visit via Telephone Note   This visit type was conducted due to national recommendations for restrictions regarding the COVID-19 Pandemic (e.g. social distancing) in an effort to limit this patient's exposure and mitigate transmission in our community.  Due to her co-morbid illnesses, this patient is at least at moderate risk for complications without adequate follow up.  This format is felt to be most appropriate for this patient at this time.  The patient did not have access to video technology/had technical difficulties with video requiring transitioning to audio format only (telephone).  All issues noted in this document were discussed and addressed.  No physical exam could be performed with this format.  Please refer to the patient's chart for her  consent to telehealth for [facility].   Date:  05/22/2019   ID:  Marie Young, DOB Oct 30, 1955, MRN [id number]  Patient Location: Home Provider Location: Home  PCP:  Patient, No Pcp Per  Cardiologist:  Olga MillersBrian Crenshaw, MD  Electrophysiologist:  None   Evaluation Performed:  Follow-Up Visit  Chief Complaint:  none  History of Present Illness:    Marie Young is a 63 y.o. female with a history of coronary disease, she had bypass surgery x4 in December 2017.  She was seen in the office 05/05/2019.  She had a high-pitched left carotid bruit on exam and subsequent carotid Doppler study showed an 80 to 99% left internal carotid artery stenosis.  She was contacted about this and is scheduled to have an angiogram later this month.  She is being followed by a cardiovascular group which is closer to her home in [place].  She is not had any signs or symptoms of a stroke.  She is understandably anxious about the procedure but is committed to follow through with this.  The patient does not have symptoms concerning for COVID-19 infection (fever, chills, cough, or new shortness of breath).    Past Medical History:  Diagnosis Date   . Anxiety   . Arthritis    "a little in my right shoulder" (06/08/2016)  . COPD (chronic obstructive pulmonary disease) (HCC)    pt unaware of this dx on 06/08/2016  . 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 05/2016: cath showing multivessel CAD with 85% Prox RCA stenosis, 40% Ost LAD, 65% Mid-LAD, 95% 1st Diag, and 99% 2nd Mrg stenosis. b. 06/09/2016: Underwent CABG with LIMA-LAD, Free RIMA-D1, SVG-OM1, and SVG-RCA  . Emphysema lung (HCC)   . Headache    "stress" (06/08/2016)  . History of blood transfusion 1984   w/childbirth  . NSTEMI (non-ST elevated myocardial infarction) (HCC) 06/07/2016  . Situational depression 2011   "going thru divorce"   Past Surgical History:  Procedure Laterality Date  . CARDIAC CATHETERIZATION  06/08/2016  . CARDIAC CATHETERIZATION N/A 06/08/2016   Procedure: Left Heart Cath and Coronary Angiography;  Surgeon: Marykay Lexavid W Harding, MD;  [facility];  Service: Cardiovascular;  Laterality: N/A;  . CORNEAL LACERATION REPAIR Right 2016   "poked eye 3-4 years earlier; wore patch for awhile; something started growing on cornea & messed w/my vision so they fixed it"  . CORONARY ARTERY BYPASS GRAFT N/A 06/09/2016   Procedure: CORONARY ARTERY BYPASS GRAFTING x 4 -LIMA to LAD -FREE to DIAGONAL -SVG to OM 1 -SVG to RCA  UTILIZING BILATERAL INTERNAL MAMMARY ARTERY AND ENDOSCOPICALLY HARVESTED RIGHT SAPHENEOUS VEIN;  Surgeon: Purcell Nailslarence H Owen, MD;  Location:  [facility] OR;  Service: Open Heart Surgery;  Laterality: N/A;  . DILATION AND CURETTAGE OF UTERUS    . EYE SURGERY    . 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 SHOULDER ARTHROSCOPY WITH ROTATOR CUFF REPAIR Right 03/20/2016   Dr. Rennis Chris  . TEE WITHOUT CARDIOVERSION N/A 06/09/2016   Procedure: TRANSESOPHAGEAL ECHOCARDIOGRAM (TEE);  Surgeon: Purcell Nails, MD;   Location: [facility] OR;  Service: Open Heart Surgery;  Laterality: N/A;     Current Meds  Medication Sig  . aspirin EC 81 MG EC tablet Take 1 tablet (81 mg total) by mouth daily.  Marland Kitchen atorvastatin (LIPITOR) 80 MG tablet Take 1 tablet (80 mg total) by mouth daily at 6 PM.  . clopidogrel (PLAVIX) 75 MG tablet Take 75 mg by mouth daily.  . Collagen-Boron-Hyaluronic Acid ([facility] JOINT HEALTH TRIPLE ACTION PO) Take 1 tablet by mouth daily.  Marland Kitchen lisinopril (ZESTRIL) 20 MG tablet Take 1 tablet (20 mg total) by mouth daily. *NEEDS OFFICE VISIT-3RD AND FINAL ATTEMPT*  . metoprolol tartrate (LOPRESSOR) 25 MG tablet Take 1 tablet (25 mg total) by mouth 2 (two) times daily.  . Multiple Vitamins-Minerals (CENTRUM ADULTS PO) Take 1 tablet by mouth daily.  . NON FORMULARY as needed.     Allergies:   Macrobid [nitrofurantoin macrocrystal], Sulfa antibiotics, and Hydrocodone   Social History   Tobacco Use  . Smoking status: Former Smoker    Packs/day: 0.50    Years: 35.00    Pack years: 17.50    Quit date: 06/09/2016    Years since quitting: 2.9  . Smokeless tobacco: Never Used  Substance Use Topics  . Alcohol use: No  . Drug use: No     Family Hx: The patient's family history includes Dementia in her mother; Heart disease in her sister; Valvular heart disease in her brother.  ROS:   Please see the history of present illness.    All other systems reviewed and are negative.   Prior CV studies:   The following studies were reviewed today:   Labs/Other Tests and Data Reviewed:    EKG:  No ECG reviewed.  Recent Labs: 04/25/2019: ALT 18; BUN 16; Creatinine, Ser 0.61; Hemoglobin 13.8; Platelets 434; Potassium 5.0; Sodium 140   Recent Lipid Panel Lab Results  Component Value Date/Time   CHOL 128 04/25/2019 12:29 PM   TRIG 61 04/25/2019 12:29 PM   HDL 60 04/25/2019 12:29 PM   CHOLHDL 2.1 04/25/2019 12:29 PM   CHOLHDL 2.5 08/25/2016 10:21 AM   LDLCALC 55 04/25/2019 12:29 PM    Wt Readings from  Last 3 Encounters:  05/22/19 134 lb (60.8 kg)  05/05/19 132 lb (59.9 kg)  04/25/19 132 lb (59.9 kg)     Objective:    Vital Signs:  BP 135/87   Pulse 87   Ht 5\' 4"  (1.626 m)   Wt 134 lb (60.8 kg)   BMI 23.00 kg/m    VITAL SIGNS:  reviewed  ASSESSMENT & PLAN:    Carotid stenosis- Asymptomatic 80-99% LICA stenosis. She is scheduled to have an angiogram later this month by a cardiovascular group closer to her home in . Plavix was added.   S/P CABG x 4 CABG Dec 2017-LIMA to LAD, free RIMA to Diagonal Branch, SVG to RCA, SVG to OM1  Centrilobular emphysema (HCC) "Mild" on chest CTA 2017  Hyperlipidemia LDL goal <70 She has resumed her statin  Rx as directed.   Essential hypertension B/P stable   COVID-19 Education: The signs and symptoms of COVID-19 were discussed with the patient and how to seek care for testing (follow up with PCP or arrange E-visit).  The importance of social distancing was discussed today.  Time:   Today, I have spent 10 minutes with the patient with telehealth technology discussing the above problems.     Medication Adjustments/Labs and Tests Ordered: Current medicines are reviewed at length with the patient today.  Concerns regarding medicines are outlined above.   Tests Ordered: No orders of the defined types were placed in this encounter.   Medication Changes: No orders of the defined types were placed in this encounter.   Follow Up:  In Person Dr Stanford Breed in May   Signed, Abigial [place], [place]  05/22/2019 1:32 PM    [place]

## 2019-05-22 NOTE — Telephone Encounter (Signed)
Contacted patient to discuss AVS Instructions. Gave patient Luke's recommendations from today's virtual office visit. Informed patient that someone from the scheduling dept will be in contact with them to schedule their follow up appt. Patient voiced understanding and AVS mailed.    

## 2019-05-30 ENCOUNTER — Other Ambulatory Visit: Payer: Self-pay | Admitting: Cardiology

## 2019-07-02 ENCOUNTER — Other Ambulatory Visit: Payer: Self-pay | Admitting: *Deleted

## 2019-07-02 MED ORDER — METOPROLOL TARTRATE 25 MG PO TABS: 25.0000 mg | ORAL_TABLET | Freq: Two times a day (BID) | ORAL | 2 refills | Status: DC

## 2019-07-04 ENCOUNTER — Other Ambulatory Visit: Payer: Self-pay | Admitting: Cardiology

## 2019-10-19 NOTE — Progress Notes (Signed)
Virtual Visit via Telephone Note   This visit type was conducted due to national recommendations for restrictions regarding the COVID-19 Pandemic (e.g. social distancing) in an effort to limit this patient's exposure and mitigate transmission in our community.  Due to her co-morbid illnesses, this patient is at least at moderate risk for complications without adequate follow up.  This format is felt to be most appropriate for this patient at this time.  The patient did not have access to video technology/had technical difficulties with video requiring transitioning to audio format only (telephone).  All issues noted in this document were discussed and addressed.  No physical exam could be performed with this format.  Please refer to the patient's chart for her  consent to telehealth for [facility].   Date:  10/19/2019   ID:  Marie Young, DOB [birth year], MRN [id number]  Patient Location: Home Provider Location: Home  PCP:  Patient, No Pcp Per  Cardiologist:  [name], MD  Electrophysiologist:  None   Evaluation Performed:  Follow-Up Visit  Chief Complaint:  Follow Up  History of Present Illness:    Marie Young is a 64 y.o. female we are following for ongoing assessment and management of CAD, with CABG in 05/2016 (LIMA to LAD, Free RIMA to diagonal branch, SVG to OM1),, carotid artery disease with  80%-99% stenosis of the LICA.She is being followed by  [facility] in [place] Dr. Ansel Bong Neomia Dear. Last seen in the office by Corine Shelter, PA on 05/22/2019. Plavix was added to her regimen.   Had left carotid endarterectomy Jun 01, 2020 in  [facility] in [place]. She had follow up appointment in Feb 2020 and was taken off of Plavix March 2021. Lisinopril was increased to 40 mg daily.  She unfortunately continues to smoke, she has decreased to 1/2 pk daily from 1-ppd to 1.5 ppd.   Dr.Obrian is following her labs. She would like to change  to a local cardiologist in [place]. She has not yet been established. She has moved to [place], [place].  The patient does not have symptoms concerning for COVID-19 infection (fever, chills, cough, or new shortness of breath).    Past Medical History:  Diagnosis Date  . Anxiety   . Arthritis    "a little in my right shoulder" (06/08/2016)  . COPD (chronic obstructive pulmonary disease) (HCC)    pt unaware of this dx on 06/08/2016  . 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 05/2016: cath showing multivessel CAD with 85% Prox RCA stenosis, 40% Ost LAD, 65% Mid-LAD, 95% 1st Diag, and 99% 2nd Mrg stenosis. b. 06/09/2016: Underwent CABG with LIMA-LAD, Free RIMA-D1, SVG-OM1, and SVG-RCA  . Emphysema lung (HCC)   . Headache    "stress" (06/08/2016)  . History of blood transfusion 1984   w/childbirth  . NSTEMI (non-ST elevated myocardial infarction) (HCC) 06/07/2016  . Situational depression 2011   "going thru divorce"   Past Surgical History:  Procedure Laterality Date  . CARDIAC CATHETERIZATION  06/08/2016  . CARDIAC CATHETERIZATION N/A 06/08/2016   Procedure: Left Heart Cath and Coronary Angiography;  Surgeon: Marykay Lex, MD;  Location: [facility] INVASIVE CV LAB;  Service: Cardiovascular;  Laterality: N/A;  . CORNEAL LACERATION REPAIR Right 2016   "poked eye 3-4 years earlier; wore patch for awhile; something started growing on cornea & messed w/my vision so they fixed it"  . CORONARY  ARTERY BYPASS GRAFT N/A 06/09/2016   Procedure: CORONARY ARTERY BYPASS GRAFTING x 4 -LIMA to LAD -FREE to DIAGONAL -SVG to OM 1 -SVG to RCA  UTILIZING BILATERAL INTERNAL MAMMARY ARTERY AND ENDOSCOPICALLY HARVESTED RIGHT SAPHENEOUS VEIN;  Surgeon: Purcell Nails, MD;  Location: [facility] OR;  Service: Open Heart Surgery;  Laterality: N/A;  . DILATION AND CURETTAGE OF UTERUS    . EYE SURGERY    . MOUTH SURGERY Left 1973 - 2016 X 3    "tumors growning in lower jaw; had to have them removed"  . SHOULDER ARTHROSCOPY WITH ROTATOR CUFF REPAIR Right 03/20/2016   Dr. Rennis Chris  . TEE WITHOUT CARDIOVERSION N/A 06/09/2016   Procedure: TRANSESOPHAGEAL ECHOCARDIOGRAM (TEE);  Surgeon: Purcell Nails, MD;  Location: [facility] OR;  Service: Open Heart Surgery;  Laterality: N/A;     No outpatient medications have been marked as taking for the 10/20/19 encounter (Appointment) with Jodelle Gross, NP.     Allergies:   Macrobid [nitrofurantoin macrocrystal], Sulfa antibiotics, and Hydrocodone   Social History   Tobacco Use  . Smoking status: Former Smoker    Packs/day: 0.50    Years: 35.00    Pack years: 17.50    Quit date: 06/09/2016    Years since quitting: 3.3  . Smokeless tobacco: Never Used  Substance Use Topics  . Alcohol use: No  . Drug use: No     Family Hx: The patient's family history includes Dementia in her mother; Heart disease in her sister; Valvular heart disease in her brother.  ROS:   Please see the history of present illness.    All other systems reviewed and are negative.   Prior CV studies:   The following studies were reviewed today: 04/30/2019 Summary:  Right Carotid: Velocities in the right ICA are consistent with a 40-59%         stenosis.   Left Carotid: Velocities in the left ICA are consistent with a 80-99%  stenosis.        Post stenotic turbulent flow.   Vertebrals: Bilateral vertebral arteries demonstrate antegrade flow.  Subclavians: Normal flow hemodynamics were seen in bilateral subclavian        arteries.   Labs/Other Tests and Data Reviewed:    EKG:  No ECG reviewed.  Recent Labs: 04/25/2019: ALT 18; BUN 16; Creatinine, Ser 0.61; Hemoglobin 13.8; Platelets 434; Potassium 5.0; Sodium 140   Recent Lipid Panel Lab Results  Component Value Date/Time   CHOL 128 04/25/2019 12:29 PM   TRIG 61 04/25/2019 12:29 PM   HDL 60 04/25/2019 12:29 PM   CHOLHDL 2.1  04/25/2019 12:29 PM   CHOLHDL 2.5 08/25/2016 10:21 AM   LDLCALC 55 04/25/2019 12:29 PM    Wt Readings from Last 3 Encounters:  05/22/19 134 lb (60.8 kg)  05/05/19 132 lb (59.9 kg)  04/25/19 132 lb (59.9 kg)     Objective:    Vital Signs:  There were no vitals taken for this visit.   VITAL SIGNS:  reviewed GEN:  no acute distress NEURO:  alert and oriented x 3, no obvious focal deficit PSYCH:  normal affect  ASSESSMENT & PLAN:    1. CAD: She offers no complaints of chest pain, DOE, but is tired. She is medically compliant. She is to be established with new cardiologist in [place]. She has labs drawn with [facility], Dr. Neomia Dear. She also needs to be established with new PCP.   2. Carotid Artery Disease: S/P carotid endarterectomy in December  of 2020. Still followed by Dr. Darryl Lent.   3. Hyperlipidemia: Goal of LDL < 70.    COVID-19 Education: The signs and symptoms of COVID-19 were discussed with the patient and how to seek care for testing (follow up with PCP or arrange E-visit).  The importance of social distancing was discussed today.  Time:   Today, I have spent 20 minutes with the patient with telehealth technology discussing the above problems.     Medication Adjustments/Labs and Tests Ordered: Current medicines are reviewed at length with the patient today.  Concerns regarding medicines are outlined above.   Tests Ordered: No orders of the defined types were placed in this encounter.   Medication Changes: No orders of the defined types were placed in this encounter.   Disposition:  Follow up PRN   Signed, Phill Myron. [place] Pugh, ANP, AACC  10/19/2019 8:29 AM    [facility]

## 2019-10-20 ENCOUNTER — Telehealth (INDEPENDENT_AMBULATORY_CARE_PROVIDER_SITE_OTHER): Payer: Medicaid - Out of State | Admitting: Adult Health

## 2019-10-20 ENCOUNTER — Encounter: Payer: Self-pay | Admitting: Adult Health

## 2019-10-20 VITALS — Ht 64.0 in | Wt 132.0 lb

## 2019-10-20 DIAGNOSIS — E7800 Pure hypercholesterolemia, unspecified: Secondary | ICD-10-CM

## 2019-10-20 DIAGNOSIS — I10 Essential (primary) hypertension: Secondary | ICD-10-CM | POA: Diagnosis not present

## 2019-10-20 DIAGNOSIS — Z951 Presence of aortocoronary bypass graft: Secondary | ICD-10-CM | POA: Diagnosis not present

## 2019-10-20 DIAGNOSIS — I6522 Occlusion and stenosis of left carotid artery: Secondary | ICD-10-CM

## 2019-10-20 DIAGNOSIS — Z720 Tobacco use: Secondary | ICD-10-CM

## 2019-10-20 NOTE — Patient Instructions (Signed)
Medication Instructions:  Continue current medications  *If you need a refill on your cardiac medications before your next appointment, please call your pharmacy*   Lab Work: None ordered   Testing/Procedures: None Ordered   Follow-Up: At [facility], you and your health needs are our priority.  As part of our continuing mission to provide you with exceptional heart care, we have created designated Provider Care Teams.  These Care Teams include your primary Cardiologist (physician) and Advanced Practice Providers (APPs -  Physician Assistants and Nurse Practitioners) who all work together to provide you with the care you need, when you need it.  We recommend signing up for the patient portal called "MyChart".  Sign up information is provided on this After Visit Summary.  MyChart is used to connect with patients for Virtual Visits (Telemedicine).  Patients are able to view lab/test results, encounter notes, upcoming appointments, etc.  Non-urgent messages can be sent to your provider as well.   To learn more about what you can do with MyChart, go to [email or web address].

## 2020-07-02 ENCOUNTER — Other Ambulatory Visit: Payer: Self-pay | Admitting: Cardiology
